# Patient Record
Sex: Female | Born: 2000 | Race: White | Hispanic: No | Marital: Married | Smoking: Never smoker
Health system: Southern US, Community
[De-identification: ages and names within clinical notes are randomized; demographics above are authoritative.]

## PROBLEM LIST (undated history)

## (undated) DIAGNOSIS — N83209 Unspecified ovarian cyst, unspecified side: Secondary | ICD-10-CM

## (undated) DIAGNOSIS — F419 Anxiety disorder, unspecified: Secondary | ICD-10-CM

## (undated) DIAGNOSIS — N63 Unspecified lump in unspecified breast: Secondary | ICD-10-CM

## (undated) HISTORY — DX: Unspecified ovarian cyst, unspecified side: N83.209

## (undated) HISTORY — DX: Anxiety disorder, unspecified: F41.9

## (undated) HISTORY — PX: NO PAST SURGERIES: SHX2092

---
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
## 2021-02-18 ENCOUNTER — Encounter: Payer: Self-pay | Admitting: Physician Assistant

## 2021-02-18 ENCOUNTER — Ambulatory Visit (INDEPENDENT_AMBULATORY_CARE_PROVIDER_SITE_OTHER): Payer: BC Managed Care – PPO | Admitting: Physician Assistant

## 2021-02-18 ENCOUNTER — Other Ambulatory Visit: Payer: Self-pay

## 2021-02-18 VITALS — BP 121/96 | HR 126 | Temp 98.9°F | Ht 61.0 in | Wt 193.1 lb

## 2021-02-18 DIAGNOSIS — F411 Generalized anxiety disorder: Secondary | ICD-10-CM | POA: Diagnosis not present

## 2021-02-18 DIAGNOSIS — R030 Elevated blood-pressure reading, without diagnosis of hypertension: Secondary | ICD-10-CM | POA: Diagnosis not present

## 2021-02-18 MED ORDER — SERTRALINE HCL 50 MG PO TABS: 50.0000 mg | ORAL_TABLET | Freq: Every day | ORAL | 1 refills | Status: DC

## 2021-02-18 NOTE — Progress Notes (Signed)
I,Pamela Salas,acting as a Education administrator for [employer], PA-C.,have documented all relevant documentation on the behalf of Pamela Kirschner, PA-C,as directed by  Pamela Kirschner, PA-C while in the presence of Pamela Kirschner, PA-C.  New Patient Office Visit  Subjective:  Patient ID: Pamela Salas, female    DOB: 03-10-2000  Age: 21 y.o. MRN: [id number]  CC: No chief complaint on file.   HPI Pamela Salas presents for anxiety.  Anxiety Patient is here for evaluation of anxiety.  She has the following anxiety symptoms: racing thoughts, nausea and headaches . Onset of symptoms was approximately several years ago.  Symptoms have been gradually worsening since that time. She denies current suicidal and homicidal ideation. Family history significant for anxiety.Possible organic causes contributing are: none. Risk factors: none Previous treatment includes none.   She is noticing her anxiety is beginning to affect her day to day life, she quit her job due to the stress and anxiety it was bringing her, she has cancelled several interviews due to anxiety. Feels she cannot talk to her husband about it, nervous about seeing a therapist. Anxious making appointments for herself, calling to order food.  Will feel flushed, nauseous when her anxiety is at its peak.   Past Medical History:  Diagnosis Date   Breast mass     Past Surgical History:  Procedure Laterality Date   NO PAST SURGERIES      Family History  Problem Relation Age of Onset   Fibromyalgia Mother    Healthy Father    Healthy Sister    Healthy Brother    Depression Maternal Grandmother    Thyroid disease Maternal Grandmother    Aneurysm Maternal Grandmother    Colon cancer Maternal Grandfather    Lung cancer Maternal Grandfather    Healthy Paternal Grandmother    Healthy Paternal Grandfather     Social History   Socioeconomic History   Marital status: Single    Spouse name: Not on file   Number of children: Not on file    Years of education: Not on file   Highest education level: Not on file  Occupational History   Not on file  Tobacco Use   Smoking status: Never   Smokeless tobacco: Never  Substance and Sexual Activity   Alcohol use: No    Alcohol/week: 0.0 standard drinks   Drug use: No   Sexual activity: Not on file  Other Topics Concern   Not on file  Social History Narrative   Not on file   Social Determinants of Health   Financial Resource Strain: Not on file  Food Insecurity: Not on file  Transportation Needs: Not on file  Physical Activity: Not on file  Stress: Not on file  Social Connections: Not on file  Intimate Partner Violence: Not on file    ROS Review of Systems  Cardiovascular:  Negative for chest pain.  Psychiatric/Behavioral:  Positive for agitation. Negative for sleep disturbance and suicidal ideas. The patient is nervous/anxious.    Objective:   Blood pressure (!) 121/96, pulse (!) 126, temperature 98.9 F (37.2 C), temperature source Oral, height 5\' 1"  (1.549 m), weight 193 lb 1.6 oz (87.6 kg), SpO2 100 %.   Physical Exam Constitutional:      General: She is awake.     Appearance: She is well-developed.  HENT:     Head: Normocephalic.  Eyes:     Conjunctiva/sclera: Conjunctivae normal.  Cardiovascular:     Rate and Rhythm: Regular rhythm. Tachycardia  present.     Heart sounds: Normal heart sounds.  Pulmonary:     Effort: Pulmonary effort is normal.     Breath sounds: Normal breath sounds.  Skin:    General: Skin is warm.  Neurological:     Mental Status: She is alert and oriented to person, place, and time.  Psychiatric:        Attention and Perception: Attention normal.        Mood and Affect: Mood normal.        Speech: Speech normal.        Behavior: Behavior is cooperative.    Assessment & Plan:   Problem List Items Addressed This Visit       Other   GAD (generalized anxiety disorder) - Primary    Lengthy discussion of anxiety and how it  can impact [place] physically Advised therapy + medication are the best combo, but she feels she cannot start therapy at this time rx zoloft 50 mg daily, advised of SE and SE of SI, denies current SI Will f/u 6 weeks      Relevant Medications   sertraline (ZOLOFT) 50 MG tablet   Elevated blood-pressure reading without diagnosis of hypertension    Repeat much lower. Likely anxiety driven will monitor        Return in about 6 weeks (around 04/01/2021) for anxiety. I, Pamela Kirschner, PA-C have reviewed all documentation for this visit. The documentation on  02/18/2021 for the exam, diagnosis, procedures, and orders are all accurate and complete.  Pamela Kirschner, PA-C [facility] [address] [place], [place], [address] Office: [phone] Fax: [phone]

## 2021-02-18 NOTE — Assessment & Plan Note (Signed)
Lengthy discussion of anxiety and how it can impact [place] physically Advised therapy + medication are the best combo, but she feels she cannot start therapy at this time rx zoloft 50 mg daily, advised of SE and SE of SI, denies current SI Will f/u 6 weeks

## 2021-02-18 NOTE — Assessment & Plan Note (Signed)
Repeat much lower. Likely anxiety driven will monitor

## 2021-04-01 ENCOUNTER — Ambulatory Visit (INDEPENDENT_AMBULATORY_CARE_PROVIDER_SITE_OTHER): Payer: BC Managed Care – PPO | Admitting: Physician Assistant

## 2021-04-01 ENCOUNTER — Other Ambulatory Visit: Payer: Self-pay

## 2021-04-01 ENCOUNTER — Encounter: Payer: Self-pay | Admitting: Physician Assistant

## 2021-04-01 VITALS — BP 135/97 | HR 103 | Ht 61.0 in | Wt 191.7 lb

## 2021-04-01 DIAGNOSIS — R030 Elevated blood-pressure reading, without diagnosis of hypertension: Secondary | ICD-10-CM

## 2021-04-01 DIAGNOSIS — Z23 Encounter for immunization: Secondary | ICD-10-CM | POA: Diagnosis not present

## 2021-04-01 DIAGNOSIS — F411 Generalized anxiety disorder: Secondary | ICD-10-CM | POA: Diagnosis not present

## 2021-04-01 NOTE — Progress Notes (Signed)
I,Pamela Salas,acting as a Education administrator for [employer], PA-C.,have documented all relevant documentation on the behalf of Pamela Kirschner, PA-C,as directed by  Pamela Kirschner, PA-C while in the presence of Pamela Kirschner, PA-C.  Established Patient Office Visit  Subjective:  Patient ID: Pamela Salas, female    DOB: December 10, 2000  Age: 21 y.o. MRN: [id number]  CC: anxiety f/u  HPI CABELA GUMMER presents for a 6 week for follow-up for anxiety. Anxiety, Follow-up  She was last seen for anxiety 6 weeks ago. Changes made at last visit include zoloft 50 mg qd.   She reports excellent compliance with treatment. She reports excellent tolerance of treatment. She is not having side effects. Was at first of nauseas and insomnia but both have stopped.  She feels her anxiety is mild and Improved since last visit. Reports less irritable, able to have normal conversations w/ partner, able to call and order food/make appointments. Feels like out of control, less physical anxiety symptoms. Reports feeling normal nervousness--which makes her happy because it does not progress beyond that.  Denies trying to look for job or interview quite yet. Denies SI.  Symptoms: No chest pain No difficulty concentrating  No dizziness No fatigue  No feelings of losing control No insomnia  No irritable No palpitations  No panic attacks No racing thoughts  No shortness of breath No sweating  No tremors/shakes    GAD-7 Results GAD-7 Generalized Anxiety Disorder Screening Tool 04/01/2021 02/18/2021  1. Feeling Nervous, Anxious, or on Edge 0 3  2. Not Being Able to Stop or Control Worrying 0 3  3. Worrying Too Much About Different Things 0 3  4. Trouble Relaxing 0 0  5. Being So Restless it's Hard To Sit Still 0 0  6. Becoming Easily Annoyed or Irritable 0 1  7. Feeling Afraid As If Something Awful Might Happen 0 1  Total GAD-7 Score 0 11  Difficulty At Work, Home, or Getting  Along With Others? Not difficult at  all Somewhat difficult    PHQ-9 Scores PHQ9 SCORE ONLY 04/01/2021 02/18/2021  PHQ-9 Total Score 1 2    ---------------------------------------------------------------------------------------------------   Past Medical History:  Diagnosis Date   Breast mass     Past Surgical History:  Procedure Laterality Date   NO PAST SURGERIES      Family History  Problem Relation Age of Onset   Fibromyalgia Mother    Healthy Father    Healthy Sister    Healthy Brother    Depression Maternal Grandmother    Thyroid disease Maternal Grandmother    Aneurysm Maternal Grandmother    Colon cancer Maternal Grandfather    Lung cancer Maternal Grandfather    Healthy Paternal Grandmother    Healthy Paternal Grandfather     Social History   Socioeconomic History   Marital status: Married    Spouse name: Not on file   Number of children: Not on file   Years of education: Not on file   Highest education level: Not on file  Occupational History   Not on file  Tobacco Use   Smoking status: Never   Smokeless tobacco: Never  Substance and Sexual Activity   Alcohol use: No    Alcohol/week: 0.0 standard drinks   Drug use: No   Sexual activity: Not on file  Other Topics Concern   Not on file  Social History Narrative   Not on file   Social Determinants of Health   Financial Resource Strain: Not on  file  Food Insecurity: Not on file  Transportation Needs: Not on file  Physical Activity: Not on file  Stress: Not on file  Social Connections: Not on file  Intimate Partner Violence: Not on file    Outpatient Medications Prior to Visit  Medication Sig Dispense Refill   sertraline (ZOLOFT) 50 MG tablet Take 1 tablet (50 mg total) by mouth daily. 90 tablet 1   No facility-administered medications prior to visit.    No Known Allergies  ROS Review of Systems  Constitutional:  Negative for fatigue and fever.  Respiratory:  Negative for cough and shortness of breath.    Cardiovascular:  Negative for chest pain and leg swelling.  Gastrointestinal:  Negative for abdominal pain.  Neurological:  Negative for dizziness and headaches.     Objective:    Physical Exam Constitutional:      Appearance: Normal appearance. She is not ill-appearing.  HENT:     Head: Normocephalic.  Eyes:     Conjunctiva/sclera: Conjunctivae normal.  Cardiovascular:     Rate and Rhythm: Normal rate.  Pulmonary:     Effort: Pulmonary effort is normal.  Neurological:     Mental Status: She is oriented to person, place, and time.  Psychiatric:        Mood and Affect: Mood normal.        Behavior: Behavior normal.        Thought Content: Thought content normal.        Judgment: Judgment normal.    BP (!) 135/97 (BP Location: Left Arm, Patient Position: Sitting, Cuff Size: Normal)    Pulse (!) 103    Ht 5\' 1"  (1.549 m)    Wt 191 lb 11.2 oz (87 kg)    SpO2 98%    BMI 36.22 kg/m  Wt Readings from Last 3 Encounters:  04/01/21 191 lb 11.2 oz (87 kg)  02/18/21 193 lb 1.6 oz (87.6 kg)  03/14/17 168 lb 3.2 oz (76.3 kg) (93 %, Z= 1.48)*   * Growth percentiles are based on CDC (Girls, 2-20 Years) data.     Health Maintenance Due  Topic Date Due   COVID-19 Vaccine (1) Never done   HIV Screening  Never done   HPV VACCINES (2 - 3-dose series) 02/01/2015   Hepatitis C Screening  Never done   PAP-Cervical Cytology Screening  Never done   PAP SMEAR-Modifier  Never done       Topic Date Due   HPV VACCINES (2 - 3-dose series) 02/01/2015    No results found for: TSH Lab Results  Component Value Date   WBC 13.7 05/17/2011   HGB 12.7 05/17/2011   HCT 38.8 05/17/2011   MCV 86 05/17/2011   PLT 237 05/17/2011   Lab Results  Component Value Date   NA 142 (H) 05/17/2011   K 3.8 05/17/2011   CO2 25 05/17/2011   GLUCOSE 119 (H) 05/17/2011   BUN 9 05/17/2011   CREATININE 0.72 05/17/2011   BILITOT 0.2 05/17/2011   ALKPHOS 113 (L) 05/17/2011   AST 23 05/17/2011   ALT 17  05/17/2011   PROT 7.2 05/17/2011   ALBUMIN 4.2 05/17/2011   CALCIUM 9.2 05/17/2011   ANIONGAP 10 05/17/2011   No results found for: CHOL No results found for: HDL No results found for: LDLCALC No results found for: TRIG No results found for: CHOLHDL No results found for: WFUX3A    Assessment & Plan:   Problem List Items Addressed This Visit  Other   GAD (generalized anxiety disorder) - Primary    Much improved! Continue Zoloft 50 mg Discussed therapy again, she still does not feel ready. Advised she can reach out at any time.        Elevated blood-pressure reading without diagnosis of hypertension    Elevated again, if at next visit which should be CPE still elevated, will advise she check at home.  Likely white coat/anxiety HTN      Other Visit Diagnoses     Need for Td vaccine       Relevant Orders   Td : Tetanus/diphtheria >7yo Preservative  free (Completed)      Return in about 8 weeks (around 05/27/2021) for CPE.  I, Pamela Kirschner, PA-C have reviewed all documentation for this visit. The documentation on  04/01/2021 for the exam, diagnosis, procedures, and orders are all accurate and complete.  Pamela Kirschner, PA-C [facility] [address] [place], [place], [address] Office: [phone] Fax: [phone]

## 2021-04-01 NOTE — Assessment & Plan Note (Signed)
Much improved! Continue Zoloft 50 mg Discussed therapy again, she still does not feel ready. Advised she can reach out at any time.

## 2021-04-01 NOTE — Assessment & Plan Note (Signed)
Elevated again, if at next visit which should be CPE still elevated, will advise she check at home.  Likely white coat/anxiety HTN

## 2021-06-03 ENCOUNTER — Encounter: Payer: Self-pay | Admitting: Physician Assistant

## 2021-06-03 ENCOUNTER — Other Ambulatory Visit (HOSPITAL_COMMUNITY)
Admission: RE | Admit: 2021-06-03 | Discharge: 2021-06-03 | Disposition: A | Discharge status: Home / Self Care | Payer: BC Managed Care – PPO | Source: Ambulatory Visit | Attending: Physician Assistant | Admitting: Physician Assistant

## 2021-06-03 ENCOUNTER — Ambulatory Visit (INDEPENDENT_AMBULATORY_CARE_PROVIDER_SITE_OTHER): Payer: BC Managed Care – PPO | Admitting: Physician Assistant

## 2021-06-03 VITALS — BP 135/90 | HR 106 | Temp 98.2°F | Ht 61.0 in | Wt 181.6 lb

## 2021-06-03 DIAGNOSIS — Z114 Encounter for screening for human immunodeficiency virus [HIV]: Secondary | ICD-10-CM | POA: Diagnosis not present

## 2021-06-03 DIAGNOSIS — Z124 Encounter for screening for malignant neoplasm of cervix: Secondary | ICD-10-CM | POA: Diagnosis not present

## 2021-06-03 DIAGNOSIS — Z113 Encounter for screening for infections with a predominantly sexual mode of transmission: Secondary | ICD-10-CM

## 2021-06-03 DIAGNOSIS — R37 Sexual dysfunction, unspecified: Secondary | ICD-10-CM | POA: Diagnosis not present

## 2021-06-03 DIAGNOSIS — Z1159 Encounter for screening for other viral diseases: Secondary | ICD-10-CM | POA: Diagnosis not present

## 2021-06-03 DIAGNOSIS — F411 Generalized anxiety disorder: Secondary | ICD-10-CM

## 2021-06-03 DIAGNOSIS — Z23 Encounter for immunization: Secondary | ICD-10-CM | POA: Diagnosis not present

## 2021-06-03 DIAGNOSIS — Z0000 Encounter for general adult medical examination without abnormal findings: Secondary | ICD-10-CM

## 2021-06-03 DIAGNOSIS — E669 Obesity, unspecified: Secondary | ICD-10-CM | POA: Diagnosis not present

## 2021-06-03 MED ORDER — BUPROPION HCL ER (SR) 150 MG PO TB12: 150.0000 mg | ORAL_TABLET | Freq: Every day | ORAL | 1 refills | Status: DC

## 2021-06-03 NOTE — Assessment & Plan Note (Signed)
Discussed open discussion with partner ?Trying new things ?Could try wellbutrin 150 mg daily.  ?Advised could eventually increase to 150 mg bid ?F/u 6 mo but earlier if needed ?

## 2021-06-03 NOTE — Progress Notes (Deleted)
? ?I,Sha'taria Adenike Shidler,acting as a Education administrator for [employer], PA-C.,have documented all relevant documentation on the behalf of [name], PA-C,as directed by  [name], PA-C while in the presence of [name], PA-C. ? ?Complete physical exam ? ? ?Patient: Pamela Salas   DOB: 08-Nov-2000   20 y.o. Female  MRN: [id number] ?Visit Date: 06/03/2021 ? ?Today's healthcare provider: [name], PA-C  ? ?No chief complaint on file. ? ?Subjective  ?  ?Pamela Salas is a 21 y.o. female who presents today for a complete physical exam.  ?She reports consuming a  low carb  diet.  The patient reports going to the gym at least 4 time a week for at least a hour using the treadmill and light weights.  She generally feels well. She reports sleeping fairly well. She does not have additional problems to discuss today.  ?HPI  ?-Menstrual shorter  ? ?Past Medical History:  ?Diagnosis Date  ? Breast mass   ? ?Past Surgical History:  ?Procedure Laterality Date  ? NO PAST SURGERIES    ? ?Social History  ? ?Socioeconomic History  ? Marital status: Married  ?  Spouse name: Not on file  ? Number of children: Not on file  ? Years of education: Not on file  ? Highest education level: Not on file  ?Occupational History  ? Not on file  ?Tobacco Use  ? Smoking status: Never  ? Smokeless tobacco: Never  ?Substance and Sexual Activity  ? Alcohol use: No  ?  Alcohol/week: 0.0 standard drinks  ? Drug use: No  ? Sexual activity: Not on file  ?Other Topics Concern  ? Not on file  ?Social History Narrative  ? Not on file  ? ?Social Determinants of Health  ? ?Financial Resource Strain: Not on file  ?Food Insecurity: Not on file  ?Transportation Needs: Not on file  ?Physical Activity: Not on file  ?Stress: Not on file  ?Social Connections: Not on file  ?Intimate Partner Violence: Not on file  ? ?Family Status  ?Relation Name Status  ? Mother  Alive  ? Father  Alive  ? Sister  Alive  ? Brother  Alive  ? MGM  Alive  ? MGF  Deceased  ? PGM   Alive  ? PGF  Alive  ? ?Family History  ?Problem Relation Age of Onset  ? Fibromyalgia Mother   ? Healthy Father   ? Healthy Sister   ? Healthy Brother   ? Depression Maternal Grandmother   ? Thyroid disease Maternal Grandmother   ? Aneurysm Maternal Grandmother   ? Colon cancer Maternal Grandfather   ? Lung cancer Maternal Grandfather   ? Healthy Paternal Grandmother   ? Healthy Paternal Grandfather   ? ?No Known Allergies  ?Patient Care Team: ?Emelia Loron as PCP - General (Physician Assistant)  ? ?Medications: ?Outpatient Medications Prior to Visit  ?Medication Sig  ? sertraline (ZOLOFT) 50 MG tablet Take 1 tablet (50 mg total) by mouth daily.  ? ?No facility-administered medications prior to visit.  ? ? ?Review of Systems  ?Allergic/Immunologic: Positive for environmental allergies.  ?All other systems reviewed and are negative. ? ?{Labs  Heme  Chem  Endocrine  Serology  Results Review (optional):23779} ? Objective  ?  ?There were no vitals taken for this visit. ?{Show previous vital signs (optional):23777} ? ? ?Physical Exam  ?*** ? ?Last depression screening scores ? ?  04/01/2021  ?  1:44 PM 02/18/2021  ?  4:00  PM  ?PHQ 2/9 Scores  ?PHQ - 2 Score 0 0  ?PHQ- 9 Score 1 2  ? ?Last fall risk screening ? ?  04/01/2021  ?  1:44 PM  ?Fall Risk   ?Falls in the past year? 0  ?Number falls in past yr: 0  ?Injury with Fall? 0  ?Risk for fall due to : No Fall Risks  ? ?Last Audit-C alcohol use screening ? ?  04/01/2021  ?  1:42 PM  ?Alcohol Use Disorder Test (AUDIT)  ?1. How often do you have a drink containing alcohol? 0  ?2. How many drinks containing alcohol do you have on a typical day when you are drinking? 0  ?3. How often do you have six or more drinks on one occasion? 1  ?AUDIT-C Score 1  ? ?A score of 3 or more in women, and 4 or more in men indicates increased risk for alcohol abuse, EXCEPT if all of the points are from question 1  ? ?No results found for any visits on 06/03/21. ? Assessment & Plan  ?   ?Routine Health Maintenance and Physical Exam ? ?Exercise Activities and Dietary recommendations ? Goals   ?None ?  ? ? ?Immunization History  ?Administered Date(s) Administered  ? DTaP 10/19/1999, 02/29/2000, 04/03/2000, 12/05/2000, 09/13/2004  ? HPV 9-valent 01/04/2015  ? Hepatitis A 09/13/2010, 04/06/2011  ? Hepatitis B 2000-09-03, 02/29/2000, 11/17/2004  ? HiB (PRP-OMP) 10/19/1999, 02/29/2000, 04/03/2000, 10/22/2000  ? IPV 10/19/1999, 02/29/2000, 09/13/2004  ? Influenza,inj,Quad PF,6+ Mos 01/04/2015, 12/17/2015  ? MMR 12/05/2000, 09/13/2004, 10/25/2004  ? Meningococcal Conjugate 09/13/2010  ? Pneumococcal-Unspecified 10/19/1999, 02/29/2000, 04/03/2000  ? Td 09/13/2010, 04/01/2021  ? Tdap 09/13/2010  ? Varicella 10/22/2000, 09/13/2010  ? ? ?Health Maintenance  ?Topic Date Due  ? COVID-19 Vaccine (1) Never done  ? HIV Screening  Never done  ? HPV VACCINES (2 - 3-dose series) 02/01/2015  ? Hepatitis C Screening  Never done  ? PAP-Cervical Cytology Screening  Never done  ? PAP SMEAR-Modifier  Never done  ? INFLUENZA VACCINE  09/06/2021  ? TETANUS/TDAP  04/02/2031  ? ? ?Discussed health benefits of physical activity, and encouraged her to engage in regular exercise appropriate for her age and condition. ? ?*** ? ?No follow-ups on file.  ?  ? ?{provider attestation***:1} ? ? ?[name], PA-C  ?[facility] ?[phone] (phone) ?[phone] (fax) ? ?[facility] ? ?

## 2021-06-03 NOTE — Progress Notes (Signed)
? ?I,[name],acting as a Education administrator for [employer], PA-C.,have documented all relevant documentation on the behalf of [name], PA-C,as directed by  [name], PA-C while in the presence of [name], PA-C. ? ?Complete physical exam ? ? ?Patient: Pamela Salas   DOB: 2000-04-10   20 y.o. Female  MRN: [id number] ?Visit Date: 06/03/2021 ? ?Today's healthcare provider: [name], PA-C  ? ?Cc. cpe ? ?Subjective  ?  ?Pamela Salas is a 21 y.o. female who presents today for a complete physical exam.  ?She reports consuming a  low carb  diet.  The patient reports going to the gym at least 4 time a week for at least a hour using the treadmill and light weights.  She generally feels well. She reports sleeping fairly well. She does not have additional problems to discuss today.  ?HPI  ?Reports in general her menstrual cycles have been getting shorter. ?Reports some sexual dysfunction with the sertraline. No interest in sex.  ? ?Past Medical History:  ?Diagnosis Date  ? Breast mass   ? ?Past Surgical History:  ?Procedure Laterality Date  ? NO PAST SURGERIES    ? ?Social History  ? ?Socioeconomic History  ? Marital status: Married  ?  Spouse name: Not on file  ? Number of children: Not on file  ? Years of education: Not on file  ? Highest education level: Not on file  ?Occupational History  ? Not on file  ?Tobacco Use  ? Smoking status: Never  ? Smokeless tobacco: Never  ?Substance and Sexual Activity  ? Alcohol use: No  ?  Alcohol/week: 0.0 standard drinks  ? Drug use: No  ? Sexual activity: Not on file  ?Other Topics Concern  ? Not on file  ?Social History Narrative  ? Not on file  ? ?Social Determinants of Health  ? ?Financial Resource Strain: Not on file  ?Food Insecurity: Not on file  ?Transportation Needs: Not on file  ?Physical Activity: Not on file  ?Stress: Not on file  ?Social Connections: Not on file  ?Intimate Partner Violence: Not on file  ? ?Family Status  ?Relation Name Status  ?  Mother  Alive  ? Father  Alive  ? Sister  Alive  ? Brother  Alive  ? MGM  Alive  ? MGF  Deceased  ? PGM  Alive  ? PGF  Alive  ? ?Family History  ?Problem Relation Age of Onset  ? Fibromyalgia Mother   ? Healthy Father   ? Healthy Sister   ? Healthy Brother   ? Depression Maternal Grandmother   ? Thyroid disease Maternal Grandmother   ? Aneurysm Maternal Grandmother   ? Colon cancer Maternal Grandfather   ? Lung cancer Maternal Grandfather   ? Healthy Paternal Grandmother   ? Healthy Paternal Grandfather   ? ?No Known Allergies  ?Patient Care Team: ?Emelia Loron as PCP - General (Physician Assistant)  ? ?Medications: ?Outpatient Medications Prior to Visit  ?Medication Sig  ? sertraline (ZOLOFT) 50 MG tablet Take 1 tablet (50 mg total) by mouth daily.  ? ?No facility-administered medications prior to visit.  ? ? ?Review of Systems  ?Allergic/Immunologic: Positive for environmental allergies.  ?All other systems reviewed and are negative. ? ? Objective  ? ?  ?Blood pressure 135/90, pulse (!) 106, temperature 98.2 ?F (36.8 ?C), temperature source Oral, height _0  (1.549 m), weight 181 lb 9.6 oz (82.4 kg), SpO2 100 %.  ? ? ?Physical Exam  ?  Physical Exam ?Constitutional:   ?   General: She is awake.  ?   Appearance: She is well-developed. She is not ill-appearing.  ?HENT:  ?   Head: Normocephalic.  ?   Right Ear: Tympanic membrane normal.  ?   Left Ear: Tympanic membrane normal.  ?   Nose: Nose normal. No congestion or rhinorrhea.  ?   Mouth/Throat:  ?   Pharynx: No oropharyngeal exudate or posterior oropharyngeal erythema.  ?Eyes:  ?   Conjunctiva/sclera: Conjunctivae normal.  ?   Pupils: Pupils are equal, round, and reactive to light.  ?Neck:  ?   Thyroid: No thyroid mass or thyromegaly.  ?Cardiovascular:  ?   Rate and Rhythm: Normal rate and regular rhythm.  ?   Heart sounds: Normal heart sounds.  ?Pulmonary:  ?   Effort: Pulmonary effort is normal.  ?   Breath sounds: Normal breath sounds.  ?Abdominal:  ?    Palpations: Abdomen is soft.  ?   Tenderness: There is no abdominal tenderness.  ?Genitourinary: ?   Pubic Area: No rash.   ?   Labia:     ?   Right: No rash or lesion.     ?   Left: No rash or lesion.   ?   Vagina: Normal.  ?   Cervix: Normal.  ?   Uterus: Normal.   ?   Adnexa: Right adnexa normal and left adnexa normal.  ?Musculoskeletal:  ?   Right lower leg: No swelling. No edema.  ?   Left lower leg: No swelling. No edema.  ?Lymphadenopathy:  ?   Cervical: No cervical adenopathy.  ?Skin: ?   General: Skin is warm.  ?Neurological:  ?   Mental Status: She is alert and oriented to person, place, and time.  ?Psychiatric:     ?   Attention and Perception: Attention normal.     ?   Mood and Affect: Mood normal.     ?   Speech: Speech normal.     ?   Behavior: Behavior normal. Behavior is cooperative.  ? ? ? ? ?Last depression screening scores ? ?  06/03/2021  ?  3:31 PM 04/01/2021  ?  1:44 PM 02/18/2021  ?  4:00 PM  ?PHQ 2/9 Scores  ?PHQ - 2 Score 0 0 0  ?PHQ- 9 Score 0 1 2  ? ? ?  04/01/2021  ?  1:45 PM 02/18/2021  ?  4:00 PM  ?GAD 7 : Generalized Anxiety Score  ?Nervous, Anxious, on Edge 0 3  ?Control/stop worrying 0 3  ?Worry too much - different things 0 3  ?Trouble relaxing 0 0  ?Restless 0 0  ?Easily annoyed or irritable 0 1  ?Afraid - awful might happen 0 1  ?Total GAD 7 Score 0 11  ?Anxiety Difficulty Not difficult at all Somewhat difficult  ? ?  ?Last fall risk screening ? ?  06/03/2021  ?  3:30 PM  ?Fall Risk   ?Falls in the past year? 0  ?Number falls in past yr: 0  ?Injury with Fall? 0  ?Risk for fall due to : No Fall Risks  ? ?Last Audit-C alcohol use screening ? ?  06/03/2021  ?  3:30 PM  ?Alcohol Use Disorder Test (AUDIT)  ?1. How often do you have a drink containing alcohol? 2  ?2. How many drinks containing alcohol do you have on a typical day when you are drinking? 0  ?3. How often do you  have six or more drinks on one occasion? 1  ?AUDIT-C Score 3  ? ?A score of 3 or more in women, and 4 or more in men  indicates increased risk for alcohol abuse, EXCEPT if all of the points are from question 1  ? ?No results found for any visits on 06/03/21. ? Assessment & Plan  ?  ?Routine Health Maintenance and Physical Exam ? ?Exercise Activities and Dietary recommendations ?--balanced diet high in fiber and protein, low in sugars, carbs, fats. ?--physical activity/exercise 30 minutes 3-5 times a week   ? ? ?Immunization History  ?Administered Date(s) Administered  ? DTaP 10/19/1999, 02/29/2000, 04/03/2000, 12/05/2000, 09/13/2004  ? HPV 9-valent 01/04/2015, 06/03/2021  ? Hepatitis A 09/13/2010, 04/06/2011  ? Hepatitis B [birth year]99, 02/29/2000, 11/17/2004  ? HiB (PRP-OMP) 10/19/1999, 02/29/2000, 04/03/2000, 10/22/2000  ? IPV 10/19/1999, 02/29/2000, 09/13/2004  ? Influenza,inj,Quad PF,6+ Mos 01/04/2015, 12/17/2015  ? MMR 12/05/2000, 09/13/2004, 10/25/2004  ? Meningococcal Conjugate 09/13/2010  ? Pneumococcal-Unspecified 10/19/1999, 02/29/2000, 04/03/2000  ? Td 09/13/2010, 04/01/2021  ? Tdap 09/13/2010  ? Varicella 10/22/2000, 09/13/2010  ? ? ?Health Maintenance  ?Topic Date Due  ? Hepatitis C Screening  Never done  ? PAP-Cervical Cytology Screening  Never done  ? PAP SMEAR-Modifier  Never done  ? HPV VACCINES (3 - 3-dose series) 10/03/2021  ? COVID-19 Vaccine (1) 06/19/2021 (Originally 02/13/2000)  ? INFLUENZA VACCINE  09/06/2021  ? TETANUS/TDAP  04/02/2031  ? HIV Screening  Completed  ? ? ?Discussed health benefits of physical activity, and encouraged her to engage in regular exercise appropriate for her age and condition. ? ?Problem List Items Addressed This Visit   ? ?  ? Other  ? GAD (generalized anxiety disorder)  ?  Much improved with sertraline. Having some sexual side effects, see other note ? ?  ?  ? Relevant Medications  ? buPROPion (WELLBUTRIN SR) 150 MG 12 hr tablet  ? Other Relevant Orders  ? TSH + free T4  ? Sexual dysfunction  ?  Discussed open discussion with partner ?Trying new things ?Could try wellbutrin 150 mg  daily.  ?Advised could eventually increase to 150 mg bid ?F/u 6 mo but earlier if needed ? ?  ?  ? Relevant Medications  ? buPROPion (WELLBUTRIN SR) 150 MG 12 hr tablet  ? ?Other Visit Diagnoses   ? ? [place]

## 2021-06-03 NOTE — Assessment & Plan Note (Signed)
Much improved with sertraline. Having some sexual side effects, see other note ?

## 2021-06-04 LAB — HEMOGLOBIN A1C
Est. average glucose Bld gHb Est-mCnc: 108 mg/dL
Hgb A1c MFr Bld: 5.4 % (ref 4.8–5.6)

## 2021-06-04 LAB — COMPREHENSIVE METABOLIC PANEL
ALT: 19 IU/L (ref 0–32)
AST: 21 IU/L (ref 0–40)
Albumin/Globulin Ratio: 2 (ref 1.2–2.2)
Albumin: 4.5 g/dL (ref 3.9–5.0)
Alkaline Phosphatase: 96 IU/L (ref 44–121)
BUN/Creatinine Ratio: 19 (ref 9–23)
BUN: 11 mg/dL (ref 6–20)
Bilirubin Total: <0.2 mg/dL (ref 0.0–1.2)
CO2: 17 mmol/L — ABNORMAL LOW (ref 20–29)
Calcium: 9.3 mg/dL (ref 8.7–10.2)
Chloride: 102 mmol/L (ref 96–106)
Creatinine, Ser: 0.58 mg/dL (ref 0.57–1.00)
Globulin, Total: 2.3 g/dL (ref 1.5–4.5)
Glucose: 73 mg/dL (ref 70–99)
Potassium: 4.4 mmol/L (ref 3.5–5.2)
Sodium: 140 mmol/L (ref 134–144)
Total Protein: 6.8 g/dL (ref 6.0–8.5)
eGFR: 132 mL/min/{1.73_m2} (ref >59)

## 2021-06-04 LAB — CBC WITH DIFFERENTIAL/PLATELET
Basophils Absolute: 0 10*3/uL (ref 0.0–0.2)
Basos: 0 %
EOS (ABSOLUTE): 0 10*3/uL (ref 0.0–0.4)
Eos: 0 %
Hematocrit: 37.8 % (ref 34.0–46.6)
Hemoglobin: 12.4 g/dL (ref 11.1–15.9)
Immature Grans (Abs): 0 10*3/uL (ref 0.0–0.1)
Immature Granulocytes: 0 %
Lymphocytes Absolute: 1.5 10*3/uL (ref 0.7–3.1)
Lymphs: 21 %
MCH: 27 pg (ref 26.6–33.0)
MCHC: 32.8 g/dL (ref 31.5–35.7)
MCV: 82 fL (ref 79–97)
Monocytes Absolute: 0.7 10*3/uL (ref 0.1–0.9)
Monocytes: 10 %
Neutrophils Absolute: 4.9 10*3/uL (ref 1.4–7.0)
Neutrophils: 69 %
Platelets: 258 10*3/uL (ref 150–450)
RBC: 4.6 x10E6/uL (ref 3.77–5.28)
RDW: 14.2 % (ref 11.7–15.4)
WBC: 7.3 10*3/uL (ref 3.4–10.8)

## 2021-06-04 LAB — HEPATITIS C ANTIBODY: Hep C Virus Ab: NONREACTIVE

## 2021-06-04 LAB — LIPID PANEL
Chol/HDL Ratio: 4.2 ratio (ref 0.0–4.4)
Cholesterol, Total: 131 mg/dL (ref 100–199)
HDL: 31 mg/dL — ABNORMAL LOW (ref >39)
LDL Chol Calc (NIH): 85 mg/dL (ref 0–99)
Triglycerides: 77 mg/dL (ref 0–149)
VLDL Cholesterol Cal: 15 mg/dL (ref 5–40)

## 2021-06-04 LAB — TSH+FREE T4
Free T4: 1.34 ng/dL (ref 0.82–1.77)
TSH: 1.88 u[IU]/mL (ref 0.450–4.500)

## 2021-06-04 LAB — HIV ANTIBODY (ROUTINE TESTING W REFLEX): HIV Screen 4th Generation wRfx: NONREACTIVE

## 2021-06-08 LAB — CYTOLOGY - PAP
Chlamydia: NEGATIVE
Comment: NEGATIVE
Comment: NEGATIVE
Comment: NEGATIVE
Comment: NORMAL
Diagnosis: NEGATIVE
High risk HPV: NEGATIVE
Neisseria Gonorrhea: NEGATIVE
Trichomonas: NEGATIVE

## 2021-07-25 ENCOUNTER — Other Ambulatory Visit: Payer: Self-pay | Admitting: Physician Assistant

## 2021-07-25 DIAGNOSIS — F411 Generalized anxiety disorder: Secondary | ICD-10-CM

## 2021-10-28 ENCOUNTER — Other Ambulatory Visit: Payer: Self-pay | Admitting: Physician Assistant

## 2021-10-28 DIAGNOSIS — F411 Generalized anxiety disorder: Secondary | ICD-10-CM

## 2021-11-29 DIAGNOSIS — Z111 Encounter for screening for respiratory tuberculosis: Secondary | ICD-10-CM | POA: Diagnosis not present

## 2021-12-01 ENCOUNTER — Ambulatory Visit: Payer: BC Managed Care – PPO | Admitting: Physician Assistant

## 2021-12-01 DIAGNOSIS — Z0279 Encounter for issue of other medical certificate: Secondary | ICD-10-CM | POA: Diagnosis not present

## 2021-12-06 ENCOUNTER — Other Ambulatory Visit: Payer: Self-pay | Admitting: Physician Assistant

## 2021-12-06 DIAGNOSIS — R37 Sexual dysfunction, unspecified: Secondary | ICD-10-CM

## 2021-12-09 ENCOUNTER — Encounter: Payer: Self-pay | Admitting: Physician Assistant

## 2021-12-09 ENCOUNTER — Other Ambulatory Visit: Payer: Self-pay | Admitting: Physician Assistant

## 2021-12-09 ENCOUNTER — Ambulatory Visit: Payer: BC Managed Care – PPO | Admitting: Physician Assistant

## 2021-12-09 VITALS — BP 118/69 | HR 102 | Wt 185.5 lb

## 2021-12-09 DIAGNOSIS — Z3201 Encounter for pregnancy test, result positive: Secondary | ICD-10-CM | POA: Diagnosis not present

## 2021-12-09 NOTE — Progress Notes (Signed)
     I,[name],acting as a Education administrator for [employer], PA-C.,have documented all relevant documentation on the behalf of Pamela Kirschner, PA-C,as directed by  Pamela Kirschner, PA-C while in the presence of Pamela Kirschner, PA-C.   Established patient visit   Patient: Pamela Salas   DOB: 2000-09-01   21 y.o. Female  MRN: [id number] Visit Date: 12/09/2021  Today's healthcare provider: [name], PA-C   Cc. Positive pregnancy test  Subjective    HPI  Pt reports a positive home pregnancy test on Wednesday. Her LMP was 11/05/2021.  She wants to make sure she can continue taking her medications, and wonders about her last HPV vaccine.  She also needs an OB referral.  Medications: Outpatient Medications Prior to Visit  Medication Sig   buPROPion (WELLBUTRIN SR) 150 MG 12 hr tablet TAKE 1 TABLET BY MOUTH EVERY DAY   sertraline (ZOLOFT) 50 MG tablet TAKE 1 TABLET BY MOUTH EVERY DAY   No facility-administered medications prior to visit.    Review of Systems  Constitutional:  Negative for appetite change, chills, fatigue and fever.  Respiratory:  Negative for chest tightness and shortness of breath.   Cardiovascular:  Negative for chest pain and palpitations.  Gastrointestinal:  Negative for abdominal pain, nausea and vomiting.  Neurological:  Negative for dizziness and weakness.     Objective    Blood pressure 118/69, pulse (!) 102, weight 185 lb 8 oz (84.1 kg), last menstrual period 11/05/2021, SpO2 100 %.   Physical Exam Vitals reviewed.  Constitutional:      Appearance: She is not ill-appearing.  HENT:     Head: Normocephalic.  Eyes:     Conjunctiva/sclera: Conjunctivae normal.  Cardiovascular:     Rate and Rhythm: Normal rate.  Pulmonary:     Effort: Pulmonary effort is normal. No respiratory distress.  Neurological:     General: No focal deficit present.     Mental Status: She is alert and oriented to person, place, and time.  Psychiatric:        Mood and  Affect: Mood normal.        Behavior: Behavior normal.     No results found for any visits on 12/09/21.  Assessment & Plan     Positive pregnancy test Offered bhcg bw to confirm Advised her zoloft and wellbutrin are typically continued in pregnancy Advised HPV vaccine is not recommended in pregnancy Ref to OB  Return if symptoms worsen or fail to improve.      I, Pamela Kirschner, PA-C have reviewed all documentation for this visit. The documentation on  12/09/2021 for the exam, diagnosis, procedures, and orders are all accurate and complete.  Pamela Kirschner, PA-C [facility] [address] [place], [place], [address] Office: [phone] Fax: [place]

## 2021-12-10 LAB — BETA HCG QUANT (REF LAB): hCG Quant: 796 m[IU]/mL

## 2021-12-23 ENCOUNTER — Ambulatory Visit (INDEPENDENT_AMBULATORY_CARE_PROVIDER_SITE_OTHER): Payer: Self-pay

## 2021-12-23 DIAGNOSIS — Z3480 Encounter for supervision of other normal pregnancy, unspecified trimester: Secondary | ICD-10-CM | POA: Insufficient documentation

## 2021-12-23 DIAGNOSIS — Z369 Encounter for antenatal screening, unspecified: Secondary | ICD-10-CM

## 2021-12-23 DIAGNOSIS — Z3689 Encounter for other specified antenatal screening: Secondary | ICD-10-CM

## 2021-12-23 HISTORY — DX: Encounter for supervision of other normal pregnancy, unspecified trimester: Z34.80

## 2021-12-23 NOTE — Progress Notes (Signed)
Pt is seeing Doreene Burke for her NOB appt.

## 2021-12-23 NOTE — Progress Notes (Signed)
New OB Intake  I connected with  Pamela Salas on 12/23/21 at  1:15 PM EST by telephone and verified that I am speaking with the correct person using two identifiers. Nurse is located at [facility] and pt is located at home.  I explained I am completing New OB Intake today. We discussed her EDD of 08/12/2022 that is based on LMP of 11/05/2021. Pt is G1/P0. I reviewed her allergies, medications, Medical/Surgical/OB history, and appropriate screenings. Based on history, this is a/an pregnancy uncomplicated .   Patient Active Problem List   Diagnosis Date Noted   Sexual dysfunction 06/03/2021   GAD (generalized anxiety disorder) 02/18/2021   Elevated blood-pressure reading without diagnosis of hypertension 02/18/2021   Hydrosalpinx 12/30/2014   Ovarian retention cyst 12/30/2014    Concerns addressed today None  Delivery Plans:  Plans to deliver at [facility].  Anatomy US Explained first scheduled [place] will be soon and an anatomy scan will be done at 20 weeks.  Labs Discussed genetic screening with patient. Patient desires genetic testing to be drawn with new OB labs. Discussed possible labs to be drawn at new OB appointment.  COVID Vaccine Patient has not had COVID vaccine.   Social Determinants of Health Food Insecurity: denies food insecurity Transportation: Patient denies transportation needs.  First visit review I reviewed new OB appt with pt. I explained she will have ob bloodwork and pap smear/pelvic exam if indicated. Explained pt will be seen by Doreene Burke, CNM at first visit; encounter routed to appropriate provider.   Loran Senters, [place] 12/23/2021  1:33 PM

## 2022-01-16 ENCOUNTER — Ambulatory Visit (INDEPENDENT_AMBULATORY_CARE_PROVIDER_SITE_OTHER): Payer: BC Managed Care – PPO

## 2022-01-16 ENCOUNTER — Other Ambulatory Visit: Payer: BC Managed Care – PPO

## 2022-01-16 DIAGNOSIS — Z3687 Encounter for antenatal screening for uncertain dates: Secondary | ICD-10-CM | POA: Diagnosis not present

## 2022-01-16 DIAGNOSIS — Z3480 Encounter for supervision of other normal pregnancy, unspecified trimester: Secondary | ICD-10-CM

## 2022-01-16 DIAGNOSIS — Z369 Encounter for antenatal screening, unspecified: Secondary | ICD-10-CM

## 2022-01-16 DIAGNOSIS — Z3A10 10 weeks gestation of pregnancy: Secondary | ICD-10-CM | POA: Diagnosis not present

## 2022-01-17 LAB — CBC/D/PLT+RPR+RH+ABO+RUBIGG...
Antibody Screen: NEGATIVE
Basophils Absolute: 0 10*3/uL (ref 0.0–0.2)
Basos: 0 %
EOS (ABSOLUTE): 0 10*3/uL (ref 0.0–0.4)
Eos: 0 %
HCV Ab: NONREACTIVE
HIV Screen 4th Generation wRfx: NONREACTIVE
Hematocrit: 38 % (ref 34.0–46.6)
Hemoglobin: 12.8 g/dL (ref 11.1–15.9)
Hepatitis B Surface Ag: NEGATIVE
Immature Grans (Abs): 0.1 10*3/uL (ref 0.0–0.1)
Immature Granulocytes: 1 %
Lymphocytes Absolute: 1.5 10*3/uL (ref 0.7–3.1)
Lymphs: 14 %
MCH: 28.5 pg (ref 26.6–33.0)
MCHC: 33.7 g/dL (ref 31.5–35.7)
MCV: 85 fL (ref 79–97)
Monocytes Absolute: 0.7 10*3/uL (ref 0.1–0.9)
Monocytes: 6 %
Neutrophils Absolute: 8.5 10*3/uL — ABNORMAL HIGH (ref 1.4–7.0)
Neutrophils: 79 %
Platelets: 252 10*3/uL (ref 150–450)
RBC: 4.49 x10E6/uL (ref 3.77–5.28)
RDW: 13.5 % (ref 11.7–15.4)
RPR Ser Ql: NONREACTIVE
Rh Factor: POSITIVE
Rubella Antibodies, IGG: 3.95 index (ref >0.99)
Varicella zoster IgG: 304 index (ref >165)
WBC: 10.8 10*3/uL (ref 3.4–10.8)

## 2022-01-17 LAB — HCV INTERPRETATION

## 2022-01-20 LAB — MATERNIT 21 PLUS CORE, BLOOD
Fetal Fraction: 5
Result (T21): NEGATIVE
Trisomy 13 (Patau syndrome): NEGATIVE
Trisomy 18 (Edwards syndrome): NEGATIVE
Trisomy 21 (Down syndrome): NEGATIVE

## 2022-01-23 ENCOUNTER — Other Ambulatory Visit (HOSPITAL_COMMUNITY)
Admission: RE | Admit: 2022-01-23 | Discharge: 2022-01-23 | Disposition: A | Discharge status: Home / Self Care | Payer: BC Managed Care – PPO | Source: Ambulatory Visit | Attending: Certified Nurse Midwife | Admitting: Certified Nurse Midwife

## 2022-01-23 ENCOUNTER — Ambulatory Visit (INDEPENDENT_AMBULATORY_CARE_PROVIDER_SITE_OTHER): Payer: BC Managed Care – PPO | Admitting: Certified Nurse Midwife

## 2022-01-23 ENCOUNTER — Encounter: Payer: Self-pay | Admitting: Certified Nurse Midwife

## 2022-01-23 VITALS — BP 137/85 | HR 102 | Wt 186.7 lb

## 2022-01-23 DIAGNOSIS — Z3A11 11 weeks gestation of pregnancy: Secondary | ICD-10-CM

## 2022-01-23 DIAGNOSIS — Z3481 Encounter for supervision of other normal pregnancy, first trimester: Secondary | ICD-10-CM | POA: Diagnosis not present

## 2022-01-23 DIAGNOSIS — O26891 Other specified pregnancy related conditions, first trimester: Secondary | ICD-10-CM | POA: Insufficient documentation

## 2022-01-23 DIAGNOSIS — Z3491 Encounter for supervision of normal pregnancy, unspecified, first trimester: Secondary | ICD-10-CM | POA: Diagnosis not present

## 2022-01-23 LAB — POCT URINALYSIS DIPSTICK OB
Blood, UA: NEGATIVE
Glucose, UA: NEGATIVE
Leukocytes, UA: NEGATIVE
Nitrite, UA: NEGATIVE
POC,PROTEIN,UA: NEGATIVE
Spec Grav, UA: <=1.005 — AB (ref 1.010–1.025)
Urobilinogen, UA: 0.2 E.U./dL
pH, UA: 8 (ref 5.0–8.0)

## 2022-01-23 MED ORDER — ASPIRIN 81 MG PO TBEC: 81.0000 mg | DELAYED_RELEASE_TABLET | Freq: Every day | ORAL | 12 refills | Status: DC

## 2022-01-23 NOTE — Addendum Note (Signed)
Addended by: Fonda Kinder on: 01/23/2022 03:56 PM   Modules accepted: Orders

## 2022-01-23 NOTE — Progress Notes (Addendum)
NEW OB HISTORY AND PHYSICAL  SUBJECTIVE:       Pamela Salas is a 21 y.o. G1P0 female, Patient's last menstrual period was 11/05/2021 (exact date)., Estimated Date of Delivery: 08/12/22, [redacted]w[redacted]d, presents today for establishment of Prenatal Care. She has no unusual complaints.   Body mass index is 35.28 kg/m.  Social Relationship: married  Living : husband Work : Statistician Exercise: none currently  Alcohol/Smoking/Drug use: denies use    Gynecologic History Patient's last menstrual period was 11/05/2021 (exact date). Normal Contraception: none Last Pap: 06/03/21. Results were: normal  Obstetric History OB History  Gravida Para Term Preterm AB Living  1            SAB IAB Ectopic Multiple Live Births               # Outcome Date GA Lbr Len/2nd Weight Sex Delivery Anes PTL Lv  1 Current             Past Medical History:  Diagnosis Date   Anxiety    Breast mass    Ovarian cyst    age 70    Past Surgical History:  Procedure Laterality Date   NO PAST SURGERIES      Current Outpatient Medications on File Prior to Visit  Medication Sig Dispense Refill   buPROPion (WELLBUTRIN SR) 150 MG 12 hr tablet TAKE 1 TABLET BY MOUTH EVERY DAY 90 tablet 1   Prenatal Vit-Fe Fumarate-FA (MULTIVITAMIN-PRENATAL) 27-0.8 MG TABS tablet Take 1 tablet by mouth daily at 12 noon.     sertraline (ZOLOFT) 50 MG tablet TAKE 1 TABLET BY MOUTH EVERY DAY 90 tablet 0   No current facility-administered medications on file prior to visit.    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  Tobacco Use   Smoking status: Never   Smokeless tobacco: Never  Vaping Use   Vaping Use: Never used  Substance and Sexual Activity   Alcohol use: No    Alcohol/week: 0.0 standard drinks of  alcohol   Drug use: No   Sexual activity: Yes    Partners: Male    Birth control/protection: None  Other Topics Concern   Not on file  Social History Narrative   Not on file   Social Determinants of Health   Financial Resource Strain: Low Risk  (12/23/2021)   Overall Financial Resource Strain (CARDIA)    Difficulty of Paying Living Expenses: Not hard at all  Food Insecurity: No Food Insecurity (12/23/2021)   Hunger Vital Sign    Worried About Running Out of Food in the Last Year: Never true    Ran Out of Food in the Last Year: Never true  Transportation Needs: No Transportation Needs (12/23/2021)   PRAPARE - Administrator, Civil Service (Medical): No    Lack of Transportation (Non-Medical): No  Physical Activity: Inactive (12/23/2021)   Exercise Vital Sign    Days of Exercise per Week: 0 days    Minutes of Exercise per Session: 0 min  Stress: No Stress Concern Present (12/23/2021)   [employer] of Occupational Health - Occupational Stress Questionnaire    Feeling of Stress : Not at all  Social Connections: Moderately Integrated (12/23/2021)   Social  Connection and Isolation Panel [NHANES]    Frequency of Communication with Friends and Family: More than three times a week    Frequency of Social Gatherings with Friends and Family: Twice a week    Attends Religious Services: 1 to 4 times per year    Active Member of [employer] or Organizations: No    Attends Archivist Meetings: Never    Marital Status: Married  Human resources officer Violence: Not At Risk (12/23/2021)   Humiliation, Afraid, Rape, and Kick questionnaire    Fear of Current or Ex-Partner: No    Emotionally Abused: No    Physically Abused: No    Sexually Abused: No    Family History  Problem Relation Age of Onset   Cancer Mother 73       breast   Fibromyalgia Mother    Healthy Father    Healthy Sister    Healthy Brother    Diabetes Maternal Grandmother        pre diabetes    Depression Maternal Grandmother    Thyroid disease Maternal Grandmother    Aneurysm Maternal Grandmother    Colon cancer Maternal Grandfather    Lung cancer Maternal Grandfather    Cancer Paternal Grandmother    Healthy Paternal Grandfather     The following portions of the patient's history were reviewed and updated as appropriate: allergies, current medications, past OB history, past medical history, past surgical history, past family history, past social history, and problem list.    OBJECTIVE: Initial Physical Exam (New OB)  GENERAL APPEARANCE: alert, well appearing, in no apparent distress, oriented to person, place and time, overweight HEAD: normocephalic, atraumatic MOUTH: mucous membranes moist, pharynx normal without lesions THYROID: no thyromegaly or masses present BREASTS: no masses noted, no significant tenderness, no palpable axillary nodes, no skin changes LUNGS: clear to auscultation, no wheezes, rales or rhonchi, symmetric air entry HEART: regular rate and rhythm, no murmurs ABDOMEN: soft, nontender, nondistended, no abnormal masses, no epigastric pain, obese, and FHT present EXTREMITIES: no redness or tenderness in the calves or thighs, no edema, no limitation in range of motion, intact peripheral pulses SKIN: normal coloration and turgor, no rashes LYMPH NODES: no adenopathy palpable NEUROLOGIC: alert, oriented, normal speech, no focal findings or movement disorder noted  PELVIC EXAM EXTERNAL GENITALIA: normal appearing vulva with no masses, tenderness or lesions VAGINA: no abnormal discharge or lesions CERVIX: no lesions or cervical motion tenderness UTERUS: gravid ADNEXA: no masses palpable and nontender OB EXAM PELVIMETRY: appears adequate RECTUM: exam not indicated      01/23/2022    3:43 PM 04/01/2021    1:45 PM 02/18/2021    4:00 PM  GAD 7 : Generalized Anxiety Score  Nervous, Anxious, on Edge 0 0 3  Control/stop worrying 0 0 3  Worry too much -  different things 0 0 3  Trouble relaxing 0 0 0  Restless 0 0 0  Easily annoyed or irritable 0 0 1  Afraid - awful might happen 0 0 1  Total GAD 7 Score 0 0 11  Anxiety Difficulty  Not difficult at all Somewhat difficult     ASSESSMENT: Normal pregnancy  PLAN: Prenatal care See ordersNew OB counseling: The patient has been given an overview regarding routine prenatal care. Recommendations regarding diet, weight gain, and exercise in pregnancy were given. Prenatal testing, optional genetic testing, carrier screening, and ultrasound use in pregnancy were reviewed. Matnerit done negative. Asprin ordered due to BMI, discussed with pt and her partner  and they are in agreement. Benefits of Breast Feeding were discussed. The patient is encouraged to consider nursing her baby post partum. Reviewed Medication discussed risk & benfits for Zoloft/Wellbutrin use in pregnancy. Mother to baby fact sheets given.   Philip Aspen, CNM

## 2022-01-23 NOTE — Patient Instructions (Signed)
Prenatal Care Prenatal care is health care during pregnancy. It helps you and your unborn baby (fetus) stay as healthy as possible. Prenatal care may be provided by a midwife, a family practice doctor, a mid-level practitioner (nurse practitioner or physician assistant), or a childbirth and pregnancy doctor (obstetrician). How does this affect me? During pregnancy, you will be closely monitored for any new conditions that might develop. To lower your risk of pregnancy complications, you and your health care provider will talk about any underlying conditions you have. How does this affect my baby? Early and consistent prenatal care increases the chance that your baby will be healthy during pregnancy. Prenatal care lowers the risk that your baby will be: Born early (prematurely). Smaller than expected at birth (small for gestational age). What can I expect at the first prenatal care visit? Your first prenatal care visit will likely be the longest. You should schedule your first prenatal care visit as soon as you know that you are pregnant. Your first visit is a good time to talk about any questions or concerns you have about pregnancy. Medical history At your visit, you and your health care provider will talk about your medical history, including: Any past pregnancies. Your family's medical history. Medical history of the baby's father. Any long-term (chronic) health conditions you have and how you manage them. Any surgeries or procedures you have had. Any current over-the-counter or prescription medicines, herbs, or supplements that you are taking. Other factors that could pose a risk to your baby, including: Exposure to harmful chemicals or radiation at work or at home. Any substance use, including tobacco, alcohol, and drug use. Your home setting and your stress levels, including: Exposure to abuse or violence. Household financial strain. Your daily health habits, including diet and  exercise. Tests and screenings Your health care provider will: Measure your weight, height, and blood pressure. Do a physical exam, including a pelvic and breast exam. Perform blood tests and urine tests to check for: Urinary tract infection. Sexually transmitted infections (STIs). Low iron levels in your blood (anemia). Blood type and certain proteins on red blood cells (Rh antibodies). Infections and immunity to viruses, such as hepatitis B and rubella. HIV (human immunodeficiency virus). Discuss your options for genetic screening. Tips about staying healthy Your health care provider will also give you information about how to keep yourself and your baby healthy, including: Nutrition and taking vitamins. Physical activity. How to manage pregnancy symptoms such as nausea and vomiting (morning sickness). Infections and substances that may be harmful to your baby and how to avoid them. Food safety. Dental care. Working. Travel. Warning signs to watch for and when to call your health care provider. How often will I have prenatal care visits? After your first prenatal care visit, you will have regular visits throughout your pregnancy. The visit schedule is often as follows: Up to week 28 of pregnancy: once every 4 weeks. 28-36 weeks: once every 2 weeks. After 36 weeks: every week until delivery. Some women may have visits more or less often depending on any underlying health conditions and the health of the baby. Keep all follow-up and prenatal care visits. This is important. What happens during routine prenatal care visits? Your health care provider will: Measure your weight and blood pressure. Check for fetal heart sounds. Measure the height of your uterus in your abdomen (fundal height). This may be measured starting around week 20 of pregnancy. Check the position of your baby inside your uterus. Ask questions   about your diet, sleeping patterns, and whether you can feel the baby  move. Review warning signs to watch for and signs of labor. Ask about any pregnancy symptoms you are having and how you are dealing with them. Symptoms may include: Headaches. Nausea and vomiting. Vaginal discharge. Swelling. Fatigue. Constipation. Changes in your vision. Feeling persistently sad or anxious. Any discomfort, including back or pelvic pain. Bleeding or spotting. Make a list of questions to ask your health care provider at your routine visits. What tests might I have during prenatal care visits? You may have blood, urine, and imaging tests throughout your pregnancy, such as: Urine tests to check for glucose, protein, or signs of infection. Glucose tests to check for a form of diabetes that can develop during pregnancy (gestational diabetes mellitus). This is usually done around week 24 of pregnancy. Ultrasounds to check your baby's growth and development, to check for birth defects, and to check your baby's well-being. These can also help to decide when you should deliver your baby. A test to check for group B strep (GBS) infection. This is usually done around week 36 of pregnancy. Genetic testing. This may include blood, fluid, or tissue sampling, or imaging tests, such as an ultrasound. Some genetic tests are done during the first trimester and some are done during the second trimester. What else can I expect during prenatal care visits? Your health care provider may recommend getting certain vaccines during pregnancy. These may include: A yearly flu shot (annual influenza vaccine). This is especially important if you will be pregnant during flu season. Tdap (tetanus, diphtheria, pertussis) vaccine. Getting this vaccine during pregnancy can protect your baby from whooping cough (pertussis) after birth. This vaccine may be recommended between weeks 27 and 36 of pregnancy. A COVID-19 vaccine. Later in your pregnancy, your health care provider may give you information  about: Childbirth and breastfeeding classes. Choosing a health care provider for your baby. Umbilical cord banking. Breastfeeding. Birth control after your baby is born. The hospital labor and delivery unit and how to set up a tour. Registering at the hospital before you go into labor. Where to find more information Office on Women's Health: womenshealth.gov American Pregnancy Association: americanpregnancy.org March of Dimes: marchofdimes.org Summary Prenatal care helps you and your baby stay as healthy as possible during pregnancy. Your first prenatal care visit will most likely be the longest. You will have visits and tests throughout your pregnancy to monitor your health and your baby's health. Bring a list of questions to your visits to ask your health care provider. Make sure to keep all follow-up and prenatal care visits. This information is not intended to replace advice given to you by your health care provider. Make sure you discuss any questions you have with your health care provider. Document Revised: 11/06/2019 Document Reviewed: 11/06/2019 Elsevier Patient Education  2023 Elsevier Inc.  

## 2022-01-23 NOTE — Addendum Note (Signed)
Addended by: Fonda Kinder on: 01/23/2022 03:49 PM   Modules accepted: Orders

## 2022-01-24 LAB — URINALYSIS, ROUTINE W REFLEX MICROSCOPIC
Bilirubin, UA: NEGATIVE
Glucose, UA: NEGATIVE
Ketones, UA: NEGATIVE
Leukocytes,UA: NEGATIVE
Nitrite, UA: NEGATIVE
RBC, UA: NEGATIVE
Specific Gravity, UA: 1.024 (ref 1.005–1.030)
Urobilinogen, Ur: 1 mg/dL (ref 0.2–1.0)
pH, UA: 8 — ABNORMAL HIGH (ref 5.0–7.5)

## 2022-01-25 LAB — NICOTINE SCREEN, URINE: Cotinine Ql Scrn, Ur: NEGATIVE ng/mL

## 2022-01-25 LAB — MONITOR DRUG PROFILE 14(MW)
Amphetamine Scrn, Ur: NEGATIVE ng/mL
BARBITURATE SCREEN URINE: NEGATIVE ng/mL
BENZODIAZEPINE SCREEN, URINE: NEGATIVE ng/mL
Buprenorphine, Urine: NEGATIVE ng/mL
CANNABINOIDS UR QL SCN: NEGATIVE ng/mL
Cocaine (Metab) Scrn, Ur: NEGATIVE ng/mL
Creatinine(Crt), U: 138.9 mg/dL (ref 20.0–300.0)
Fentanyl, Urine: NEGATIVE pg/mL
Meperidine Screen, Urine: NEGATIVE ng/mL
Methadone Screen, Urine: NEGATIVE ng/mL
OXYCODONE+OXYMORPHONE UR QL SCN: NEGATIVE ng/mL
Opiate Scrn, Ur: NEGATIVE ng/mL
Ph of Urine: 7.5 (ref 4.5–8.9)
Phencyclidine Qn, Ur: NEGATIVE ng/mL
Propoxyphene Scrn, Ur: NEGATIVE ng/mL
SPECIFIC GRAVITY: 1.015
Tramadol Screen, Urine: NEGATIVE ng/mL

## 2022-01-25 LAB — URINE CYTOLOGY ANCILLARY ONLY
Chlamydia: NEGATIVE
Comment: NEGATIVE
Comment: NORMAL
Neisseria Gonorrhea: NEGATIVE

## 2022-01-25 LAB — URINE CULTURE, OB REFLEX

## 2022-01-25 LAB — CULTURE, OB URINE

## 2022-01-30 ENCOUNTER — Other Ambulatory Visit: Payer: Self-pay | Admitting: Physician Assistant

## 2022-01-30 DIAGNOSIS — F411 Generalized anxiety disorder: Secondary | ICD-10-CM

## 2022-02-06 NOTE — L&D Delivery Note (Signed)
Delivery Summary for Pamela Salas  Labor Events:   Preterm labor: No data found  Rupture date: 08/10/2022  Rupture time: 9:32 AM  Rupture type: Artificial Intact  Fluid Color: Green  Induction: No data found  Augmentation: No data found  Complications: No data found  Cervical ripening: No data found No data found   No data found     Delivery:   Episiotomy: No data found  Lacerations: No data found  Repair suture: No data found  Repair # of packets: No data found  Blood loss (ml): 1375   Information for the patient's newborn:  Aariah, Espitia [[id number]]   Delivery 08/10/2022 9:08 PM by  C-Section, Low Transverse Sex:  female Gestational Age: [redacted]w[redacted]d Delivery Clinician:   Living?:         APGARS  One minute Five minutes Ten minutes  Skin color:        Heart rate:        Grimace:        Muscle tone:        Breathing:        Totals: 7  9      Presentation/position:      Resuscitation:   Cord information:    Disposition of cord blood:     Blood gases sent?  Complications:   Placenta: Delivered:       appearance Newborn Measurements: Weight: 10 lb 3.3 oz (4630 g)  Height: 22.05"  Head circumference:    Chest circumference:    Other providers:    Additional  information: Forceps:   Vacuum:   Breech:   Observed anomalies      See Dr. Oretha Milch operative note for details of C-section procedure.    Hildred Laser, MD [facility]

## 2022-02-16 ENCOUNTER — Encounter: Payer: Self-pay | Admitting: Certified Nurse Midwife

## 2022-02-20 ENCOUNTER — Ambulatory Visit (INDEPENDENT_AMBULATORY_CARE_PROVIDER_SITE_OTHER): Payer: BC Managed Care – PPO | Admitting: Certified Nurse Midwife

## 2022-02-20 ENCOUNTER — Encounter: Payer: Self-pay | Admitting: Certified Nurse Midwife

## 2022-02-20 VITALS — BP 127/87 | HR 103 | Wt 187.0 lb

## 2022-02-20 DIAGNOSIS — Z3A15 15 weeks gestation of pregnancy: Secondary | ICD-10-CM

## 2022-02-20 DIAGNOSIS — Z3482 Encounter for supervision of other normal pregnancy, second trimester: Secondary | ICD-10-CM

## 2022-02-20 LAB — POCT URINALYSIS DIPSTICK OB
Bilirubin, UA: NEGATIVE
Blood, UA: NEGATIVE
Glucose, UA: NEGATIVE
Ketones, UA: NEGATIVE
Leukocytes, UA: NEGATIVE
Nitrite, UA: NEGATIVE
POC,PROTEIN,UA: NEGATIVE
Spec Grav, UA: 1.025 (ref 1.010–1.025)
Urobilinogen, UA: 0.2 E.U./dL — AB
pH, UA: 6.5 (ref 5.0–8.0)

## 2022-02-20 NOTE — Progress Notes (Signed)
ROB doing well, starting to feel a little better , the nausea has decreased. She is not feeling movement yet. Reassurance given. Discussed anatomy u/s next visit. She agrees .   ROB 4 wks with anatomy scan.    Philip Aspen, CNM

## 2022-02-20 NOTE — Addendum Note (Signed)
Addended by: Landis Gandy on: 02/20/2022 10:58 AM   Modules accepted: Orders

## 2022-02-20 NOTE — Patient Instructions (Signed)
Round Ligament Pain  The round ligaments are a pair of cord-like tissues that help support the uterus. They can become a source of pain during pregnancy as the ligaments soften and stretch as the baby grows. The pain usually begins in the second trimester (13-28 weeks) of pregnancy, and should only last for a few seconds when it occurs. However, the pain can come and go until the baby is delivered. The pain does not cause harm to the baby. Round ligament pain is usually a short, sharp, and pinching pain, but it can also be a dull, lingering, and aching pain. The pain is felt in the lower side of the abdomen or in the groin. It usually starts deep in the groin and moves up to the outside of the hip area. The pain may happen when you: Suddenly change position, such as quickly going from a sitting to standing position. Do physical activity. Cough or sneeze. Follow these instructions at home: Managing pain  When the pain starts, relax. Then, try any of these methods to help with the pain: Sit down. Flex your knees up to your abdomen. Lie on your side with one pillow under your abdomen and another pillow between your legs. Sit in a warm bath for 15-20 minutes or until the pain goes away. General instructions Watch your condition for any changes. Move slowly when you sit down or stand up. Stop or reduce your physical activities if they cause pain. Avoid long walks if they cause pain. Take over-the-counter and prescription medicines only as told by your health care provider. Keep all follow-up visits. This is important. Contact a health care provider if: Your pain does not go away with treatment. You feel pain in your back that you did not have before. Your medicine is not helping. You have a fever or chills. You have nausea or vomiting. You have diarrhea. You have pain when you urinate. Get help right away if: You have pain that is a rhythmic, cramping pain similar to labor pains. Labor  pains are usually 2 minutes apart, last for about 1 minute, and involve a bearing down feeling or pressure in your pelvis. You have vaginal bleeding. These symptoms may represent a serious problem that is an emergency. Do not wait to see if the symptoms will go away. Get medical help right away. Call your local emergency services (911 in the [place] Do not drive yourself to the hospital. Summary Round ligament pain is felt in the lower abdomen or groin. This pain usually begins in the second trimester (13-28 weeks) and should only last for a few seconds when it occurs. You may notice the pain when you suddenly change position, when you cough or sneeze, or during physical activity. Relaxing, flexing your knees to your abdomen, lying on one side, or taking a warm bath may help to get rid of the pain. Contact your health care provider if the pain does not go away. This information is not intended to replace advice given to you by your health care provider. Make sure you discuss any questions you have with your health care provider. Document Revised: 04/07/2020 Document Reviewed: 04/07/2020 Elsevier Patient Education  2023 Elsevier Inc.  

## 2022-02-24 ENCOUNTER — Encounter: Payer: Self-pay | Admitting: Physician Assistant

## 2022-02-24 ENCOUNTER — Ambulatory Visit: Payer: BC Managed Care – PPO | Admitting: Physician Assistant

## 2022-02-24 VITALS — BP 126/79 | HR 113 | Temp 97.8°F | Ht 61.0 in | Wt 185.0 lb

## 2022-02-24 DIAGNOSIS — R051 Acute cough: Secondary | ICD-10-CM | POA: Diagnosis not present

## 2022-02-24 DIAGNOSIS — R000 Tachycardia, unspecified: Secondary | ICD-10-CM | POA: Diagnosis not present

## 2022-02-24 DIAGNOSIS — J069 Acute upper respiratory infection, unspecified: Secondary | ICD-10-CM | POA: Diagnosis not present

## 2022-02-24 LAB — POCT INFLUENZA A/B
Influenza A, POC: NEGATIVE
Influenza B, POC: NEGATIVE

## 2022-02-24 LAB — POC COVID19 BINAXNOW: SARS Coronavirus 2 Ag: NEGATIVE

## 2022-02-24 NOTE — Progress Notes (Signed)
Established patient visit   Patient: Pamela Salas   DOB: 2000/05/18   22 y.o. Female  MRN: [id number] Visit Date: 02/24/2022  Today's healthcare provider: [name], PA-C   Cc. Nasal congestion, low grade fever, body aches, high heart rate x 1 day  Subjective    HPI  Pt is a 22 y/o female who is 56 w pregnant who presents today with symptoms of a cough, nasal congestion, fever with a t max of 35F , one episode of vomiting, high heart rate, body aches and a sore throat x 1 day. Reports she had similar symptoms  Last week that resolved.  She saw a resting heart rate of 135 last night.   Medications: Outpatient Medications Prior to Visit  Medication Sig   aspirin EC 81 MG tablet Take 1 tablet (81 mg total) by mouth daily. Swallow whole. Start at 14 weeks daily   buPROPion (WELLBUTRIN SR) 150 MG 12 hr tablet TAKE 1 TABLET BY MOUTH EVERY DAY   Prenatal Vit-Fe Fumarate-FA (MULTIVITAMIN-PRENATAL) 27-0.8 MG TABS tablet Take 1 tablet by mouth daily at 12 noon.   sertraline (ZOLOFT) 50 MG tablet TAKE 1 TABLET BY MOUTH EVERY DAY   No facility-administered medications prior to visit.    Review of Systems  Constitutional:  Positive for fatigue. Negative for fever.  HENT:  Positive for congestion, postnasal drip, rhinorrhea and sore throat.   Respiratory:  Positive for cough. Negative for shortness of breath.   Cardiovascular:  Negative for chest pain and leg swelling.  Gastrointestinal:  Negative for abdominal pain.  Neurological:  Negative for dizziness and headaches.      Objective    BP 126/79   Pulse (!) 113   Temp 97.8 F (36.6 C)   Ht 5\' 1"  (1.549 m)   Wt 185 lb (83.9 kg)   LMP 11/05/2021 (Exact Date)   SpO2 100%   BMI 34.96 kg/m   Physical Exam Constitutional:      General: She is awake.     Appearance: She is well-developed.  HENT:     Head: Normocephalic.     Mouth/Throat:     Pharynx: Posterior oropharyngeal erythema present. No oropharyngeal  exudate.  Eyes:     Conjunctiva/sclera: Conjunctivae normal.  Cardiovascular:     Rate and Rhythm: Regular rhythm. Tachycardia present.     Heart sounds: Normal heart sounds.  Pulmonary:     Effort: Pulmonary effort is normal.     Breath sounds: Normal breath sounds. No wheezing, rhonchi or rales.  Skin:    General: Skin is warm.  Neurological:     Mental Status: She is alert and oriented to person, place, and time.  Psychiatric:        Attention and Perception: Attention normal.        Mood and Affect: Mood normal.        Speech: Speech normal.        Behavior: Behavior is cooperative.     Results for orders placed or performed in visit on 02/24/22  POC COVID-19  Result Value Ref Range   SARS Coronavirus 2 Ag Negative Negative  POCT Influenza A/B  Result Value Ref Range   Influenza A, POC Negative Negative   Influenza B, POC Negative Negative    Assessment & Plan     URI Poc covid and flu a/b negative Encouraged to increase fluids, add in electrolytes Tylenol and mucinex OTC If symptoms worsen, advise urgent care/ed.  2. Tachycardia Highest of 118 bpm in office, resting around 110-113. Advised likely 2/2 to illness, dehydration.  If heart rate gets to 130+ and does not come down advised pt to go to ED  Return if symptoms worsen or fail to improve.      I, [name], PA-C have reviewed all documentation for this visit. The documentation on  02/24/22 for the exam, diagnosis, procedures, and orders are all accurate and complete.  [name], PA-C [facility] [address] [place], [place], [address] Office: [phone] Fax: [place]

## 2022-02-27 ENCOUNTER — Encounter: Payer: Self-pay | Admitting: Physician Assistant

## 2022-02-28 ENCOUNTER — Other Ambulatory Visit: Payer: Self-pay | Admitting: Physician Assistant

## 2022-02-28 MED ORDER — AMOXICILLIN 875 MG PO TABS: 875.0000 mg | ORAL_TABLET | Freq: Two times a day (BID) | ORAL | 0 refills | Status: AC

## 2022-03-14 ENCOUNTER — Telehealth: Payer: Self-pay | Admitting: Obstetrics & Gynecology

## 2022-03-14 NOTE — Telephone Encounter (Signed)
I contacted patient via phone. I left message about no ultrasound and gave patient new location for her appointment scheduled for 2/13. Patient is scheduled for [facility] at 3 pm, she will need to arrive at 2:45 with a Full bladder. My chart message will be sent

## 2022-03-15 NOTE — Telephone Encounter (Signed)
Patient called to confirm 2/13 imaging at [facility] and office visit is now 2/16 with MMF

## 2022-03-21 ENCOUNTER — Other Ambulatory Visit: Payer: BC Managed Care – PPO

## 2022-03-21 ENCOUNTER — Encounter: Payer: Self-pay | Admitting: Certified Nurse Midwife

## 2022-03-21 ENCOUNTER — Encounter: Payer: BC Managed Care – PPO | Admitting: Obstetrics & Gynecology

## 2022-03-21 ENCOUNTER — Ambulatory Visit
Admission: RE | Admit: 2022-03-21 | Discharge: 2022-03-21 | Disposition: A | Discharge status: Home / Self Care | Payer: BC Managed Care – PPO | Source: Ambulatory Visit | Attending: Certified Nurse Midwife | Admitting: Certified Nurse Midwife

## 2022-03-21 ENCOUNTER — Other Ambulatory Visit: Payer: Self-pay | Admitting: Certified Nurse Midwife

## 2022-03-21 DIAGNOSIS — Z3A19 19 weeks gestation of pregnancy: Secondary | ICD-10-CM | POA: Diagnosis not present

## 2022-03-21 DIAGNOSIS — Z3689 Encounter for other specified antenatal screening: Secondary | ICD-10-CM | POA: Diagnosis not present

## 2022-03-21 DIAGNOSIS — Z3A20 20 weeks gestation of pregnancy: Secondary | ICD-10-CM

## 2022-03-21 DIAGNOSIS — Z3A15 15 weeks gestation of pregnancy: Secondary | ICD-10-CM

## 2022-03-21 DIAGNOSIS — Z369 Encounter for antenatal screening, unspecified: Secondary | ICD-10-CM | POA: Diagnosis not present

## 2022-03-24 ENCOUNTER — Ambulatory Visit (INDEPENDENT_AMBULATORY_CARE_PROVIDER_SITE_OTHER): Payer: BC Managed Care – PPO | Admitting: Obstetrics

## 2022-03-24 VITALS — BP 122/68 | HR 97 | Wt 188.8 lb

## 2022-03-24 DIAGNOSIS — Z3480 Encounter for supervision of other normal pregnancy, unspecified trimester: Secondary | ICD-10-CM

## 2022-03-24 DIAGNOSIS — Z3482 Encounter for supervision of other normal pregnancy, second trimester: Secondary | ICD-10-CM

## 2022-03-24 DIAGNOSIS — Z3A19 19 weeks gestation of pregnancy: Secondary | ICD-10-CM

## 2022-03-24 LAB — POCT URINALYSIS DIPSTICK OB
Bilirubin, UA: NEGATIVE
Blood, UA: NEGATIVE
Glucose, UA: NEGATIVE
Leukocytes, UA: NEGATIVE
Nitrite, UA: NEGATIVE
Spec Grav, UA: 1.015 (ref 1.010–1.025)
Urobilinogen, UA: 0.2 E.U./dL
pH, UA: 6.5 (ref 5.0–8.0)

## 2022-03-24 NOTE — Progress Notes (Signed)
ROB [redacted]w[redacted]d She has been feeling little flutters. She is doing well. She has no new concerns today.

## 2022-03-24 NOTE — Progress Notes (Signed)
Routine Prenatal Care Visit  Subjective  Pamela Salas is a 22 y.o. G1P0 at 4w6dbeing seen today for ongoing prenatal care.  She is currently monitored for the following issues for this low-risk pregnancy and has Hydrosalpinx; Ovarian retention cyst; GAD (generalized anxiety disorder); Elevated blood-pressure reading without diagnosis of hypertension; Sexual dysfunction; and Supervision of other normal pregnancy, antepartum on their problem list.  ----------------------------------------------------------------------------------- Patient reports no complaints.  She is having a girl, named [name] Contractions: Not present. Vag. Bleeding: None.  Movement: Absent. Leaking Fluid denies.  ----------------------------------------------------------------------------------- The following portions of the patient's history were reviewed and updated as appropriate: allergies, current medications, past family history, past medical history, past social history, past surgical history and problem list. Problem list updated.  Objective  Blood pressure 122/68, pulse 97, weight 188 lb 12.8 oz (85.6 kg), last menstrual period 11/05/2021. Pregravid weight 180 lb (81.6 kg) Total Weight Gain 8 lb 12.8 oz (3.992 kg) Urinalysis: Urine Protein    Urine Glucose    Fetal Status:     Movement: Absent     General:  Alert, oriented and cooperative. Patient is in no acute distress.  Skin: Skin is warm and dry. No rash noted.   Cardiovascular: Normal heart rate noted  Respiratory: Normal respiratory effort, no problems with respiration noted  Abdomen: Soft, gravid, appropriate for gestational age. Pain/Pressure: Absent     Pelvic:  Cervical exam deferred        Extremities: Normal range of motion.  Edema: None  Mental Status: Normal mood and affect. Normal behavior. Normal judgment and thought content.   Assessment   22y.o. G1P0 at 174w6dy  08/12/2022, by Last Menstrual Period presenting for routine prenatal  visit Declines the AFP testing. Will need f/u sono to complete anatomy scan  Plan   first Problems (from 12/23/21 to present)     Problem Noted Resolved   Supervision of other normal pregnancy, antepartum 12/23/2021 by JoCleophas DunkerCMA No   Overview Addendum 12/23/2021  1:37 PM by JoCleophas DunkerCMLos Ranchos de [place]ff Provider  Office Location  [facility] Dating  Not found.  Language  English Anatomy [place]  Flu Vaccine  offer Genetic Screen  NIPS:   TDaP vaccine   offer Hgb A1C or  GTT Early : Third trimester :   Covid declined   LAB RESULTS   Rhogam     Blood Type     Feeding Plan breast Antibody    Contraception undecided Rubella    Circumcision yes RPR     Pediatrician  undecided HBsAg     Support Person AuLiane ComberIV Non Reactive (04/28 1519)  Prenatal Classes no Varicella     GBS  (For PCN allergy, check sensitivities)   BTL Consent  Hep C     VBAC Consent  Pap Diagnosis  Date Value Ref Range Status  06/03/2021   Final   - Negative for intraepithelial lesion or malignancy (NILM)      Hgb Electro      CF      SMA                   Preterm labor symptoms and general obstetric precautions including but not limited to vaginal bleeding, contractions, leaking of fluid and fetal movement were reviewed in detail with the patient. Please refer to After Visit Summary for other counseling recommendations.  She has not yet heard about when her repeat anatomy scan  will be done.  No follow-ups on file.  Imagene Riches, CNM  03/24/2022 3:03 PM

## 2022-04-03 ENCOUNTER — Other Ambulatory Visit: Payer: BC Managed Care – PPO

## 2022-04-06 ENCOUNTER — Ambulatory Visit
Admission: RE | Admit: 2022-04-06 | Discharge: 2022-04-06 | Disposition: A | Discharge status: Home / Self Care | Payer: BC Managed Care – PPO | Source: Ambulatory Visit | Attending: Certified Nurse Midwife | Admitting: Certified Nurse Midwife

## 2022-04-06 DIAGNOSIS — Z3A20 20 weeks gestation of pregnancy: Secondary | ICD-10-CM | POA: Diagnosis not present

## 2022-04-06 DIAGNOSIS — Z3A21 21 weeks gestation of pregnancy: Secondary | ICD-10-CM | POA: Diagnosis not present

## 2022-04-06 DIAGNOSIS — Z3689 Encounter for other specified antenatal screening: Secondary | ICD-10-CM | POA: Insufficient documentation

## 2022-04-24 ENCOUNTER — Encounter: Payer: Self-pay | Admitting: Advanced Practice Midwife

## 2022-04-24 ENCOUNTER — Ambulatory Visit (INDEPENDENT_AMBULATORY_CARE_PROVIDER_SITE_OTHER): Payer: BC Managed Care – PPO | Admitting: Advanced Practice Midwife

## 2022-04-24 VITALS — BP 133/84 | HR 113 | Wt 192.9 lb

## 2022-04-24 DIAGNOSIS — Z3482 Encounter for supervision of other normal pregnancy, second trimester: Secondary | ICD-10-CM

## 2022-04-24 DIAGNOSIS — Z369 Encounter for antenatal screening, unspecified: Secondary | ICD-10-CM

## 2022-04-24 DIAGNOSIS — Z113 Encounter for screening for infections with a predominantly sexual mode of transmission: Secondary | ICD-10-CM

## 2022-04-24 DIAGNOSIS — Z130 Encounter for screening for diseases of the blood and blood-forming organs and certain disorders involving the immune mechanism: Secondary | ICD-10-CM

## 2022-04-24 DIAGNOSIS — Z131 Encounter for screening for diabetes mellitus: Secondary | ICD-10-CM

## 2022-04-24 DIAGNOSIS — Z3A24 24 weeks gestation of pregnancy: Secondary | ICD-10-CM

## 2022-04-24 LAB — POCT URINALYSIS DIPSTICK
Bilirubin, UA: NEGATIVE
Blood, UA: NEGATIVE
Glucose, UA: NEGATIVE
Ketones, UA: NEGATIVE
Leukocytes, UA: NEGATIVE
Nitrite, UA: NEGATIVE
Protein, UA: NEGATIVE
Spec Grav, UA: 1.02 (ref 1.010–1.025)
Urobilinogen, UA: 0.2 E.U./dL
pH, UA: 7 (ref 5.0–8.0)

## 2022-04-24 NOTE — Progress Notes (Addendum)
Routine Prenatal Care Visit  Subjective  Pamela Salas is a 22 y.o. G1P0 at [redacted]w[redacted]d being seen today for ongoing prenatal care.  She is currently monitored for the following issues for this low-risk pregnancy and has Hydrosalpinx; Ovarian retention cyst; GAD (generalized anxiety disorder); Sexual dysfunction; and Supervision of other normal pregnancy, antepartum on their problem list.  ----------------------------------------------------------------------------------- Patient reports back pain that shoots down her leg. Denies headaches, blurred vision, and abdominal pain. Contractions: Not present. Vag. Bleeding: None.  Movement: Present. Leaking Fluid denies.  ----------------------------------------------------------------------------------- The following portions of the patient's history were reviewed and updated as appropriate: allergies, current medications, past family history, past medical history, past social history, past surgical history and problem list. Problem list updated.  Objective  Blood pressure 133/84, pulse (!) 113, weight 192 lb 14.4 oz (87.5 kg), last menstrual period 11/05/2021. Pregravid weight 180 lb (81.6 kg) Total Weight Gain 12 lb 14.4 oz (5.851 kg) Urinalysis: Urine Protein    Urine Glucose    Fetal Status: Fetal Heart Rate (bpm): 145 Fundal Height: 24 cm Movement: Present     General:  Alert, oriented and cooperative. Patient is in no acute distress.  Skin: Skin is warm and dry. No rash noted.   Cardiovascular: Normal heart rate noted  Respiratory: Normal respiratory effort, no problems with respiration noted  Abdomen: Soft, gravid, appropriate for gestational age. Pain/Pressure: Absent     Pelvic:  Cervical exam deferred        Extremities: Normal range of motion.  Edema: None  Mental Status: Normal mood and affect. Normal behavior. Normal judgment and thought content.   Assessment   22 y.o. G1P0 at [redacted]w[redacted]d by  08/12/2022, by Last Menstrual Period presenting for  routine prenatal visit  Plan   first Problems (from 12/23/21 to present)     Problem Noted Resolved   Supervision of other normal pregnancy, antepartum 12/23/2021 by Cleophas Dunker, CMA No   Overview Addendum 04/24/2022  1:41 PM by Rod Can, CNM     Clinical Staff Provider  Office Location  [facility] Dating  Not found.  Language  English Anatomy US    Flu Vaccine  offer Genetic Screen  NIPS:   TDaP vaccine   offer Hgb A1C or  GTT Early : Third trimester :   Covid declined   LAB RESULTS   Rhogam  O/Positive/-- (12/11 1423)  Blood Type O/Positive/-- (12/11 1423)   Feeding Plan breast Antibody Negative (12/11 1423)  Contraception undecided Rubella 3.95 (12/11 1423)  Circumcision yes RPR Non Reactive (12/11 1423)   Pediatrician  undecided HBsAg Negative (12/11 1423)   Support Person Liane Comber HIV Non Reactive (12/11 1423)  Prenatal Classes no Varicella     GBS  (For PCN allergy, check sensitivities)   BTL Consent  Hep C Non Reactive (12/11 1423)   VBAC Consent  Pap Diagnosis  Date Value Ref Range Status  06/03/2021   Final   - Negative for intraepithelial lesion or malignancy (NILM)      Hgb Electro      CF      SMA                   Preterm labor symptoms and general obstetric precautions including but not limited to vaginal bleeding, contractions, leaking of fluid and fetal movement were reviewed in detail with the patient.  Discussed maternity band, supportive shoes with compression socks, massage, and heat/ice therapy. Patient plans to travel to the beach before the baby  is born. Educated on getting out of the car and walking every 2 hours, wearing compression socks for the journey, and moving feet around in the car between stops. Discussed ways to maintain a healthy diet including getting vegetables in using creative methods such as including them in a larger dish to mask the taste/texture. Patient states desire to eat healthier, but expresses food aversions that  make that difficult. Educated patient on reasoning behind aspirin therapy for pregnant patients who have had high blood pressures and are overweight. Patient is currently not taking Wellbutrin or Zoloft and states that her mood is good and anxiety is under control. Discussed that she can go back on her medications if her mental health declines during the pregnancy.  Please refer to After Visit Summary for other counseling recommendations. Discussed upcoming 28 week labs.  Patient seen by CNM and SCNM, Pamela Salas   Return in about 4 weeks (around 05/22/2022) for 28 wk labs and rob.  Rod Can, CNM 04/24/2022 1:41 PM

## 2022-04-24 NOTE — Patient Instructions (Signed)

## 2022-05-09 ENCOUNTER — Other Ambulatory Visit: Payer: Self-pay | Admitting: Family Medicine

## 2022-05-09 DIAGNOSIS — F411 Generalized anxiety disorder: Secondary | ICD-10-CM

## 2022-05-22 ENCOUNTER — Other Ambulatory Visit: Payer: Self-pay | Admitting: Physician Assistant

## 2022-05-22 DIAGNOSIS — F411 Generalized anxiety disorder: Secondary | ICD-10-CM

## 2022-05-24 ENCOUNTER — Ambulatory Visit (INDEPENDENT_AMBULATORY_CARE_PROVIDER_SITE_OTHER): Payer: BC Managed Care – PPO | Admitting: Obstetrics

## 2022-05-24 ENCOUNTER — Other Ambulatory Visit: Payer: BC Managed Care – PPO

## 2022-05-24 VITALS — BP 126/79 | HR 105 | Wt 195.1 lb

## 2022-05-24 DIAGNOSIS — Z131 Encounter for screening for diabetes mellitus: Secondary | ICD-10-CM

## 2022-05-24 DIAGNOSIS — Z3403 Encounter for supervision of normal first pregnancy, third trimester: Secondary | ICD-10-CM

## 2022-05-24 DIAGNOSIS — Z23 Encounter for immunization: Secondary | ICD-10-CM | POA: Diagnosis not present

## 2022-05-24 DIAGNOSIS — Z3482 Encounter for supervision of other normal pregnancy, second trimester: Secondary | ICD-10-CM | POA: Diagnosis not present

## 2022-05-24 DIAGNOSIS — Z113 Encounter for screening for infections with a predominantly sexual mode of transmission: Secondary | ICD-10-CM | POA: Diagnosis not present

## 2022-05-24 DIAGNOSIS — O163 Unspecified maternal hypertension, third trimester: Secondary | ICD-10-CM

## 2022-05-24 DIAGNOSIS — Z130 Encounter for screening for diseases of the blood and blood-forming organs and certain disorders involving the immune mechanism: Secondary | ICD-10-CM | POA: Diagnosis not present

## 2022-05-24 DIAGNOSIS — Z3A28 28 weeks gestation of pregnancy: Secondary | ICD-10-CM

## 2022-05-24 DIAGNOSIS — Z369 Encounter for antenatal screening, unspecified: Secondary | ICD-10-CM

## 2022-05-24 DIAGNOSIS — Z3480 Encounter for supervision of other normal pregnancy, unspecified trimester: Secondary | ICD-10-CM

## 2022-05-24 LAB — POCT URINALYSIS DIPSTICK OB
Bilirubin, UA: NEGATIVE
Blood, UA: NEGATIVE
Glucose, UA: NEGATIVE
Ketones, UA: NEGATIVE
Leukocytes, UA: NEGATIVE
Nitrite, UA: NEGATIVE
POC,PROTEIN,UA: NEGATIVE
Spec Grav, UA: 1.015
Urobilinogen, UA: 0.2 U/dL
pH, UA: 6.5

## 2022-05-24 NOTE — Progress Notes (Addendum)
Routine Prenatal Care Visit  Subjective  Pamela Salas is a 22 y.o. G1P0 at [redacted]w[redacted]d being seen today for ongoing prenatal care.  She is currently monitored for the following issues for this low-risk pregnancy and has Hydrosalpinx; Ovarian retention cyst; GAD (generalized anxiety disorder); Sexual dysfunction; and Supervision of other normal pregnancy, antepartum on their problem list.  ----------------------------------------------------------------------------------- Patient reports fatigue.   Contractions: Not present. Vag. Bleeding: None.  Movement: Present. Leaking Fluid denies.  ----------------------------------------------------------------------------------- The following portions of the patient's history were reviewed and updated as appropriate: allergies, current medications, past family history, past medical history, past social history, past surgical history and problem list. Problem list updated.  Objective  Blood pressure 126/79, pulse (!) 105, weight 195 lb 1.6 oz (88.5 kg), last menstrual period 11/05/2021. Pregravid weight 180 lb (81.6 kg) Total Weight Gain 15 lb 1.6 oz (6.849 kg) Urinalysis: Urine Protein Negative  Urine Glucose Negative  Fetal Status: Fetal Heart Rate (bpm): 155 Fundal Height: 29 cm Movement: Present     General:  Alert, oriented and cooperative. Patient is in no acute distress.  Skin: Skin is warm and dry. No rash noted.   Cardiovascular: Normal heart rate noted  Respiratory: Normal respiratory effort, no problems with respiration noted  Abdomen: Soft, gravid, appropriate for gestational age. Pain/Pressure: Absent     Pelvic:  Cervical exam deferred        Extremities: Normal range of motion.  Edema: None  Mental Status: Normal mood and affect. Normal behavior. Normal judgment and thought content.   Assessment   22 y.o. G1P0 at [redacted]w[redacted]d by  08/12/2022, by Last Menstrual Period presenting for routine prenatal visit  Plan   first Problems (from 12/23/21  to present)     Problem Noted Resolved   Supervision of other normal pregnancy, antepartum 12/23/2021 by Loran Senters, CMA No   Overview Addendum 05/24/2022  9:23 AM by Mirna Mires, CNM     Clinical Staff Provider  Office Location  [facility] Dating  Not found.  Language  English Anatomy US  Normal female  Flu Vaccine  offer Genetic Screen  NIPS: negative, xx  TDaP vaccine   offer Hgb A1C or  GTT Early : Third trimester :   Covid declined   LAB RESULTS   Rhogam  O/Positive/-- (12/11 1423)  Blood Type O/Positive/-- (12/11 1423)   Feeding Plan breast Antibody Negative (12/11 1423)  Contraception undecided Rubella 3.95 (12/11 1423)  Circumcision yes RPR Non Reactive (12/11 1423)   Pediatrician  undecided HBsAg Negative (12/11 1423)   Support Person Eliberto Ivory HIV Non Reactive (12/11 1423)  Prenatal Classes no Varicella     GBS  (For PCN allergy, check sensitivities)   BTL Consent  Hep C Non Reactive (12/11 1423)   VBAC Consent  Pap Diagnosis  Date Value Ref Range Status  06/03/2021   Final   - Negative for intraepithelial lesion or malignancy (NILM)      Hgb Electro      CF      SMA                   Preterm labor symptoms and general obstetric precautions including but not limited to vaginal bleeding, contractions, leaking of fluid and fetal movement were reviewed in detail with the patient.  Discussed high blood pressure today, second pressure taken was normal, labs for PIH done today. Will follow-up based on labs. Discussed preeclampsia symptoms and when she should come in. Mood doing well;  discussed keeping track of her moods and depression/anxiety symptoms. Educated patient that there are medications for mood disorders that are safe to take in pregnancy and that she should talk to [place] if she wants to restart her medications. Patient expressed interest in transferring care to [facility], undecided as of today. Enjoys seeing the midwives here and is hesitant to transfer  care.  Please refer to After Visit Summary for other counseling recommendations.   Return in about 2 weeks (around 06/07/2022) for return OB. Earlie Counts, SNM Mirna Mires, CNM  05/24/2022 11:33 AM

## 2022-05-25 ENCOUNTER — Encounter: Payer: Self-pay | Admitting: Obstetrics

## 2022-05-25 LAB — 28 WEEK RH+PANEL
Basophils Absolute: 0 10*3/uL (ref 0.0–0.2)
Basos: 0 %
EOS (ABSOLUTE): 0 10*3/uL (ref 0.0–0.4)
Eos: 0 %
Gestational Diabetes Screen: 137 mg/dL (ref 70–139)
HIV Screen 4th Generation wRfx: NONREACTIVE
Hematocrit: 35.3 % (ref 34.0–46.6)
Hemoglobin: 11.1 g/dL (ref 11.1–15.9)
Immature Grans (Abs): 0.1 10*3/uL (ref 0.0–0.1)
Immature Granulocytes: 1 %
Lymphocytes Absolute: 1 10*3/uL (ref 0.7–3.1)
Lymphs: 10 %
MCH: 26.5 pg — ABNORMAL LOW (ref 26.6–33.0)
MCHC: 31.4 g/dL — ABNORMAL LOW (ref 31.5–35.7)
MCV: 84 fL (ref 79–97)
Monocytes Absolute: 0.6 10*3/uL (ref 0.1–0.9)
Monocytes: 6 %
Neutrophils Absolute: 8.6 10*3/uL — ABNORMAL HIGH (ref 1.4–7.0)
Neutrophils: 83 %
Platelets: 234 10*3/uL (ref 150–450)
RBC: 4.19 x10E6/uL (ref 3.77–5.28)
RDW: 13.6 % (ref 11.7–15.4)
RPR Ser Ql: NONREACTIVE
WBC: 10.4 10*3/uL (ref 3.4–10.8)

## 2022-06-08 ENCOUNTER — Encounter: Payer: BC Managed Care – PPO | Admitting: Obstetrics and Gynecology

## 2022-06-13 ENCOUNTER — Ambulatory Visit (INDEPENDENT_AMBULATORY_CARE_PROVIDER_SITE_OTHER): Payer: BC Managed Care – PPO | Admitting: Obstetrics and Gynecology

## 2022-06-13 ENCOUNTER — Encounter: Payer: Self-pay | Admitting: Obstetrics and Gynecology

## 2022-06-13 VITALS — BP 122/81 | HR 103 | Wt 199.6 lb

## 2022-06-13 DIAGNOSIS — Z3403 Encounter for supervision of normal first pregnancy, third trimester: Secondary | ICD-10-CM

## 2022-06-13 DIAGNOSIS — Z3A31 31 weeks gestation of pregnancy: Secondary | ICD-10-CM

## 2022-06-13 LAB — POCT URINALYSIS DIPSTICK OB
Bilirubin, UA: NEGATIVE
Blood, UA: NEGATIVE
Glucose, UA: NEGATIVE
Ketones, UA: NEGATIVE
Leukocytes, UA: NEGATIVE
Nitrite, UA: NEGATIVE
Spec Grav, UA: 1.015 (ref 1.010–1.025)
Urobilinogen, UA: 0.2 E.U./dL
pH, UA: 6.5 (ref 5.0–8.0)

## 2022-06-13 NOTE — Progress Notes (Signed)
ROB. She states daily fetal movement, denies pelvic pain or pressure. Patient states no questions or concerns at this time.

## 2022-06-13 NOTE — Progress Notes (Signed)
ROB: Has occasional nausea/indigestion after eating.  Denies pain.  Has not tried any antacids.  Reports daily fetal movement.  Blood pressure fine today.  Baby currently active

## 2022-06-26 ENCOUNTER — Ambulatory Visit (INDEPENDENT_AMBULATORY_CARE_PROVIDER_SITE_OTHER): Payer: BC Managed Care – PPO | Admitting: Obstetrics

## 2022-06-26 VITALS — BP 131/87 | HR 103 | Wt 202.0 lb

## 2022-06-26 DIAGNOSIS — Z3403 Encounter for supervision of normal first pregnancy, third trimester: Secondary | ICD-10-CM

## 2022-06-26 DIAGNOSIS — Z3A33 33 weeks gestation of pregnancy: Secondary | ICD-10-CM

## 2022-06-26 LAB — POCT URINALYSIS DIPSTICK OB
Bilirubin, UA: NEGATIVE
Blood, UA: NEGATIVE
Glucose, UA: NEGATIVE
Leukocytes, UA: NEGATIVE
Nitrite, UA: NEGATIVE
POC,PROTEIN,UA: NEGATIVE
Spec Grav, UA: 1.02 (ref 1.010–1.025)
Urobilinogen, UA: 1 E.U./dL
pH, UA: 6 (ref 5.0–8.0)

## 2022-06-26 NOTE — Progress Notes (Signed)
Routine Prenatal Care Visit  Subjective  Pamela Salas is a 22 y.o. G1P0 at [redacted]w[redacted]d being seen today for ongoing prenatal care.  She is currently monitored for the following issues for this low-risk pregnancy and has Hydrosalpinx; Ovarian retention cyst; GAD (generalized anxiety disorder); Sexual dysfunction; and Supervision of other normal pregnancy, antepartum on their problem list.  ----------------------------------------------------------------------------------- Patient reports no complaints.  Her mood is good per her and FOB's report. Does not want to restart on SSRIs. Contractions: Not present. Vag. Bleeding: None.  Movement: Present. Leaking Fluid denies.  ----------------------------------------------------------------------------------- The following portions of the patient's history were reviewed and updated as appropriate: allergies, current medications, past family history, past medical history, past social history, past surgical history and problem list. Problem list updated.  Objective  Blood pressure 131/87, pulse (!) 103, weight 202 lb (91.6 kg), last menstrual period 11/05/2021. Pregravid weight 180 lb (81.6 kg) Total Weight Gain 22 lb (9.979 kg) Urinalysis: Urine Protein    Urine Glucose    Fetal Status:     Movement: Present     General:  Alert, oriented and cooperative. Patient is in no acute distress.  Skin: Skin is warm and dry. No rash noted.   Cardiovascular: Normal heart rate noted  Respiratory: Normal respiratory effort, no problems with respiration noted  Abdomen: Soft, gravid, appropriate for gestational age. Pain/Pressure: Absent     Pelvic:  Cervical exam deferred        Extremities: Normal range of motion.  Edema: Trace  Mental Status: Normal mood and affect. Normal behavior. Normal judgment and thought content.   Assessment   22 y.o. G1P0 at [redacted]w[redacted]d by  08/12/2022, by Last Menstrual Period presenting for routine prenatal visit  Plan   first Problems (from  12/23/21 to present)     Problem Noted Resolved   Supervision of other normal pregnancy, antepartum 12/23/2021 by Loran Senters, CMA No   Overview Addendum 05/24/2022  9:23 AM by Mirna Mires, CNM     Clinical Staff Provider  Office Location  [facility] Dating  Not found.  Language  English Anatomy US  Normal female  Flu Vaccine  offer Genetic Screen  NIPS: negative, xx  TDaP vaccine   offer Hgb A1C or  GTT Early : Third trimester :   Covid declined   LAB RESULTS   Rhogam  O/Positive/-- (12/11 1423)  Blood Type O/Positive/-- (12/11 1423)   Feeding Plan breast Antibody Negative (12/11 1423)  Contraception undecided Rubella 3.95 (12/11 1423)  Circumcision yes RPR Non Reactive (12/11 1423)   Pediatrician  undecided HBsAg Negative (12/11 1423)   Support Person Eliberto Ivory HIV Non Reactive (12/11 1423)  Prenatal Classes no Varicella     GBS  (For PCN allergy, check sensitivities)   BTL Consent  Hep C Non Reactive (12/11 1423)   VBAC Consent  Pap Diagnosis  Date Value Ref Range Status  06/03/2021   Final   - Negative for intraepithelial lesion or malignancy (NILM)      Hgb Electro      CF      SMA                   Preterm labor symptoms and general obstetric precautions including but not limited to vaginal bleeding, contractions, leaking of fluid and fetal movement were reviewed in detail with the patient. Please refer to After Visit Summary for other counseling recommendations.   Return in about 2 weeks (around 07/10/2022) for return OB.  Claris Che  Liana Crocker, CNM  06/26/2022 1:52 PM

## 2022-06-29 ENCOUNTER — Ambulatory Visit: Payer: BC Managed Care – PPO | Admitting: Physician Assistant

## 2022-07-10 ENCOUNTER — Ambulatory Visit (INDEPENDENT_AMBULATORY_CARE_PROVIDER_SITE_OTHER): Payer: BC Managed Care – PPO | Admitting: Obstetrics

## 2022-07-10 VITALS — BP 112/74 | HR 112 | Wt 201.0 lb

## 2022-07-10 DIAGNOSIS — Z3483 Encounter for supervision of other normal pregnancy, third trimester: Secondary | ICD-10-CM

## 2022-07-10 DIAGNOSIS — Z113 Encounter for screening for infections with a predominantly sexual mode of transmission: Secondary | ICD-10-CM

## 2022-07-10 DIAGNOSIS — Z3A35 35 weeks gestation of pregnancy: Secondary | ICD-10-CM

## 2022-07-10 DIAGNOSIS — Z3480 Encounter for supervision of other normal pregnancy, unspecified trimester: Secondary | ICD-10-CM

## 2022-07-10 LAB — POCT URINALYSIS DIPSTICK OB
Bilirubin, UA: NEGATIVE
Blood, UA: NEGATIVE
Glucose, UA: NEGATIVE
Ketones, UA: NEGATIVE
Leukocytes, UA: NEGATIVE
Nitrite, UA: NEGATIVE
Spec Grav, UA: <=1.005 — AB (ref 1.010–1.025)
Urobilinogen, UA: 0.2 E.U./dL
pH, UA: 7.5 (ref 5.0–8.0)

## 2022-07-10 NOTE — Progress Notes (Signed)
Routine Prenatal Care Visit  Subjective  Pamela Salas is a 22 y.o. G1P0 at [redacted]w[redacted]d being seen today for ongoing prenatal care.  She is currently monitored for the following issues for this low-risk pregnancy and has Hydrosalpinx; Ovarian retention cyst; GAD (generalized anxiety disorder); Sexual dysfunction; and Supervision of other normal pregnancy, antepartum on their problem list.  ----------------------------------------------------------------------------------- Patient reports no complaints.  Her baby is moving well. She has not done much self education to prepare for labor. Contractions: Not present. Vag. Bleeding: None.  Movement: Present. Leaking Fluid denies.  ----------------------------------------------------------------------------------- The following portions of the patient's history were reviewed and updated as appropriate: allergies, current medications, past family history, past medical history, past social history, past surgical history and problem list. Problem list updated.  Objective  Blood pressure 112/74, pulse (!) 112, weight 201 lb (91.2 kg), last menstrual period 11/05/2021. Pregravid weight 180 lb (81.6 kg) Total Weight Gain 21 lb (9.526 kg) Urinalysis: Urine Protein Trace  Urine Glucose Negative  Fetal Status:     Movement: Present     General:  Alert, oriented and cooperative. Patient is in no acute distress.  Skin: Skin is warm and dry. No rash noted.   Cardiovascular: Normal heart rate noted  Respiratory: Normal respiratory effort, no problems with respiration noted  Abdomen: Soft, gravid, appropriate for gestational age. Pain/Pressure: Absent     Pelvic:  Cervical exam deferred        Extremities: Normal range of motion.     Mental Status: Normal mood and affect. Normal behavior. Normal judgment and thought content.   Assessment   22 y.o. G1P0 at [redacted]w[redacted]d by  08/12/2022, by Last Menstrual Period presenting for routine prenatal visit  Plan   first Problems  (from 12/23/21 to present)     Problem Noted Resolved   Supervision of other normal pregnancy, antepartum 12/23/2021 by Loran Senters, CMA No   Overview Addendum 07/10/2022 10:55 AM by Mirna Mires, CNM     Clinical Staff Provider  Office Location  [facility] Dating  Not found.  Language  English Anatomy US  Normal female  Flu Vaccine  offer Genetic Screen  NIPS: negative, xx  TDaP vaccine  05/24/22 Hgb A1C or  GTT Early : Third trimester : 137  Covid declined   LAB RESULTS   Rhogam  O/Positive/-- (12/11 1423)  Blood Type O/Positive/-- (12/11 1423)   Feeding Plan breast Antibody Negative (12/11 1423)  Contraception undecided Rubella 3.95 (12/11 1423)  Circumcision yes RPR Non Reactive (12/11 1423)   Pediatrician  undecided HBsAg Negative (12/11 1423)   Support Person Eliberto Ivory HIV Non Reactive (12/11 1423)  Prenatal Classes no Varicella     GBS  (For PCN allergy, check sensitivities)   BTL Consent  Hep C Non Reactive (12/11 1423)   VBAC Consent  Pap Diagnosis  Date Value Ref Range Status  06/03/2021   Final   - Negative for intraepithelial lesion or malignancy (NILM)      Hgb Electro      CF      SMA                   Preterm labor symptoms and general obstetric precautions including but not limited to vaginal bleeding, contractions, leaking of fluid and fetal movement were reviewed in detail with the patient. Please refer to After Visit Summary for other counseling recommendations.  We talked about using Youtube videos to see how others handle labor. Encouraged her to walk daily  and use her labor ball at home, and learn more about latent and active labor. Reviewed most appropriate time to go to the hospital for a cervical exam.  Return in about 1 week (around 07/17/2022) for return OB, GBS etc.  Mirna Mires, CNM  07/10/2022 10:57 AM

## 2022-07-17 ENCOUNTER — Ambulatory Visit (INDEPENDENT_AMBULATORY_CARE_PROVIDER_SITE_OTHER): Payer: BC Managed Care – PPO | Admitting: Obstetrics

## 2022-07-17 ENCOUNTER — Encounter: Payer: Self-pay | Admitting: Obstetrics and Gynecology

## 2022-07-17 ENCOUNTER — Observation Stay
Admission: EM | Admit: 2022-07-17 | Discharge: 2022-07-17 | Disposition: A | Discharge status: Home / Self Care | Payer: BC Managed Care – PPO | Attending: Family | Admitting: Family

## 2022-07-17 ENCOUNTER — Encounter: Payer: Self-pay | Admitting: Obstetrics

## 2022-07-17 ENCOUNTER — Other Ambulatory Visit (HOSPITAL_COMMUNITY)
Admission: RE | Admit: 2022-07-17 | Discharge: 2022-07-17 | Disposition: A | Discharge status: Home / Self Care | Payer: BC Managed Care – PPO | Source: Ambulatory Visit | Attending: Obstetrics | Admitting: Obstetrics

## 2022-07-17 ENCOUNTER — Other Ambulatory Visit: Payer: Self-pay

## 2022-07-17 VITALS — BP 129/93 | HR 111 | Wt 203.4 lb

## 2022-07-17 DIAGNOSIS — Z3403 Encounter for supervision of normal first pregnancy, third trimester: Secondary | ICD-10-CM

## 2022-07-17 DIAGNOSIS — Z79899 Other long term (current) drug therapy: Secondary | ICD-10-CM | POA: Diagnosis not present

## 2022-07-17 DIAGNOSIS — O368330 Maternal care for abnormalities of the fetal heart rate or rhythm, third trimester, not applicable or unspecified: Secondary | ICD-10-CM | POA: Insufficient documentation

## 2022-07-17 DIAGNOSIS — Z113 Encounter for screening for infections with a predominantly sexual mode of transmission: Secondary | ICD-10-CM | POA: Diagnosis not present

## 2022-07-17 DIAGNOSIS — Z3480 Encounter for supervision of other normal pregnancy, unspecified trimester: Secondary | ICD-10-CM

## 2022-07-17 DIAGNOSIS — Z3A36 36 weeks gestation of pregnancy: Secondary | ICD-10-CM

## 2022-07-17 DIAGNOSIS — O163 Unspecified maternal hypertension, third trimester: Secondary | ICD-10-CM | POA: Diagnosis not present

## 2022-07-17 DIAGNOSIS — Z7982 Long term (current) use of aspirin: Secondary | ICD-10-CM | POA: Diagnosis not present

## 2022-07-17 LAB — CBC WITH DIFFERENTIAL/PLATELET
Abs Immature Granulocytes: 0.09 K/uL — ABNORMAL HIGH (ref 0.00–0.07)
Basophils Absolute: 0 K/uL (ref 0.0–0.1)
Basophils Relative: 0 %
Eosinophils Absolute: 0 K/uL (ref 0.0–0.5)
Eosinophils Relative: 0 %
HCT: 36.2 % (ref 36.0–46.0)
Hemoglobin: 11.4 g/dL — ABNORMAL LOW (ref 12.0–15.0)
Immature Granulocytes: 1 %
Lymphocytes Relative: 11 %
Lymphs Abs: 1.2 K/uL (ref 0.7–4.0)
MCH: 25.1 pg — ABNORMAL LOW (ref 26.0–34.0)
MCHC: 31.5 g/dL (ref 30.0–36.0)
MCV: 79.6 fL — ABNORMAL LOW (ref 80.0–100.0)
Monocytes Absolute: 1 K/uL (ref 0.1–1.0)
Monocytes Relative: 9 %
Neutro Abs: 8.8 K/uL — ABNORMAL HIGH (ref 1.7–7.7)
Neutrophils Relative %: 79 %
Platelets: 202 K/uL (ref 150–400)
RBC: 4.55 MIL/uL (ref 3.87–5.11)
RDW: 15.3 % (ref 11.5–15.5)
WBC: 11.1 K/uL — ABNORMAL HIGH (ref 4.0–10.5)
nRBC: 0 % (ref 0.0–0.2)

## 2022-07-17 LAB — POCT URINALYSIS DIPSTICK OB
Bilirubin, UA: NEGATIVE
Blood, UA: NEGATIVE
Glucose, UA: NEGATIVE
Ketones, UA: NEGATIVE
Leukocytes, UA: NEGATIVE
Nitrite, UA: NEGATIVE
POC,PROTEIN,UA: NEGATIVE
Spec Grav, UA: 1.01 (ref 1.010–1.025)
Urobilinogen, UA: 0.2 E.U./dL
pH, UA: 6.5 (ref 5.0–8.0)

## 2022-07-17 LAB — COMPREHENSIVE METABOLIC PANEL WITH GFR
ALT: 14 U/L (ref 0–44)
AST: 21 U/L (ref 15–41)
Albumin: 3.1 g/dL — ABNORMAL LOW (ref 3.5–5.0)
Alkaline Phosphatase: 117 U/L (ref 38–126)
Anion gap: 10 (ref 5–15)
BUN: 6 mg/dL (ref 6–20)
CO2: 19 mmol/L — ABNORMAL LOW (ref 22–32)
Calcium: 8.6 mg/dL — ABNORMAL LOW (ref 8.9–10.3)
Chloride: 107 mmol/L (ref 98–111)
Creatinine, Ser: 0.63 mg/dL (ref 0.44–1.00)
GFR, Estimated: >60 mL/min
Glucose, Bld: 101 mg/dL — ABNORMAL HIGH (ref 70–99)
Potassium: 4 mmol/L (ref 3.5–5.1)
Sodium: 136 mmol/L (ref 135–145)
Total Bilirubin: 0.5 mg/dL (ref 0.3–1.2)
Total Protein: 6.3 g/dL — ABNORMAL LOW (ref 6.5–8.1)

## 2022-07-17 LAB — PROTEIN / CREATININE RATIO, URINE
Creatinine, Urine: 37 mg/dL
Total Protein, Urine: <6 mg/dL

## 2022-07-17 NOTE — OB Triage Note (Signed)
Pamela Salas 22 y.o. G1P0GA  presents to Labor & Delivery triage via wheelchair steered by ED staff reporting Elevated blood pressure and swollen feet in the office. . She denies signs and symptoms consistent with rupture of membranes or active vaginal bleeding. She denies contractions and states positive fetal movement. External FM and TOCO applied to non-tender abdomen. Initial FHR 165. Vital signs obtained and within normal limits. Patient oriented to care environment including call bell and bed control use. , CNM notified of patient's arrival. Plan to send labs and continue to evaluate.

## 2022-07-17 NOTE — Final Progress Note (Signed)
OB/Triage Note  Patient ID: Pamela Salas MRN: [id number] DOB/AGE: 2000/11/29 21 y.o.  Subjective  History of Present Illness: The patient is a 22 y.o. female G1P0 at [redacted]w[redacted]d who was sent to [facility] triage from clinic for further blood pressure evaluation. Her initial BP was 128/89, a repeat BP was done and was 129/93, her urine analysis also showed proteinuria and she appeared swollen so was sent to triage for further evaluation.  [name] denies painful contractions, LOF, VB, HA, visual changes, RUQ pain. Endorses fetal movement.   Past Medical History:  Diagnosis Date   Anxiety    Breast mass    Ovarian cyst    age 53    Past Surgical History:  Procedure Laterality Date   NO PAST SURGERIES      No current facility-administered medications on file prior to encounter.   Current Outpatient Medications on File Prior to Encounter  Medication Sig Dispense Refill   aspirin EC 81 MG tablet Take 1 tablet (81 mg total) by mouth daily. Swallow whole. Start at 14 weeks daily 30 tablet 12   buPROPion (WELLBUTRIN SR) 150 MG 12 hr tablet TAKE 1 TABLET BY MOUTH EVERY DAY (Patient not taking: Reported on 07/17/2022) 90 tablet 1   Prenatal Vit-Fe Fumarate-FA (MULTIVITAMIN-PRENATAL) 27-0.8 MG TABS tablet Take 1 tablet by mouth daily at 12 noon.      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  Tobacco Use   Smoking status: Never   Smokeless tobacco: Never  Vaping Use   Vaping Use: Never used  Substance and Sexual Activity   Alcohol use: No    Alcohol/week: 0.0 standard drinks of alcohol   Drug use: No   Sexual activity: Not Currently    Partners: Male    Birth control/protection: None  Other Topics Concern   Not on file  Social History Narrative   Not on file   Social  Determinants of Health   Financial Resource Strain: Low Risk  (12/23/2021)   Overall Financial Resource Strain (CARDIA)    Difficulty of Paying Living Expenses: Not hard at all  Food Insecurity: No Food Insecurity (12/23/2021)   Hunger Vital Sign    Worried About Running Out of Food in the Last Year: Never true    Ran Out of Food in the Last Year: Never true  Transportation Needs: No Transportation Needs (12/23/2021)   PRAPARE - Administrator, Civil Service (Medical): No    Lack of Transportation (Non-Medical): No  Physical Activity: Inactive (12/23/2021)   Exercise Vital Sign    Days of Exercise per Week: 0 days    Minutes of Exercise per Session: 0 min  Stress: No Stress Concern Present (12/23/2021)   [employer] of Occupational Health - Occupational Stress Questionnaire    Feeling of Stress : Not at all  Social Connections: Moderately Integrated (12/23/2021)   Social Connection and Isolation Panel [NHANES]    Frequency of Communication with Friends and Family: More than three times a week    Frequency of Social Gatherings with Friends and Family: Twice a week    Attends Religious Services: 1 to 4 times per year    Active Member of [employer] or Organizations: No    Attends [employer]  or Organization Meetings: Never    Marital Status: Married  Catering manager Violence: Not At Risk (12/23/2021)   Humiliation, Afraid, Rape, and Kick questionnaire    Fear of Current or Ex-Partner: No    Emotionally Abused: No    Physically Abused: No    Sexually Abused: No    Family History  Problem Relation Age of Onset   Cancer Mother 1       breast   Fibromyalgia Mother    Cancer Father    Healthy Father    Healthy Sister    Healthy Brother    COPD Maternal Grandmother    Diabetes Maternal Grandmother        pre diabetes   Depression Maternal Grandmother    Thyroid disease Maternal Grandmother    Aneurysm Maternal Grandmother    Colon cancer Maternal Grandfather    Lung  cancer Maternal Grandfather    Cancer Paternal Grandmother    Healthy Paternal Grandfather      ROS    Objective  Physical Exam: BP 139/88   Pulse (!) 116   Temp 99.4 F (37.4 C) (Oral)   Resp 18   LMP 11/05/2021 (Exact Date)   OBGyn Exam  FHT 155, mod variability, pos accels, no decels Toco: no contractions detected  Significant Findings/ Diagnostic Studies:  Preeclampsia labs are neg (PC ratio, CMP, CBC)   Hospital Course: The patient was admitted to [facility] Triage for observation. Had reactive NST. Initial two blood pressure taken only two minutes apart were mildly elevated, the rest were all WNL. Preeclampsia labs were negative. Has not been four hours between initial elevated reading so patient also does not meet criteria currently for gestational hypertension. All discussed with patient. Preeclampsia warning signs discussed, has next ROB next Monday, plans to monitor BP at home once daily until then, told to return for any severe pressures or preeclampsia s/sx.  Assessment: 22 y.o. female G1P0 at [redacted]w[redacted]d  Isolated elevated blood pressure RNST  Plan: Discharge home Follow up next Monday at [facility] Teaching: preeclampsia s/sx, home BP monitoring  Discharge Instructions     Discharge activity:  No Restrictions   Complete by: As directed    Discharge diet:  No restrictions   Complete by: As directed    Discharge instructions   Complete by: As directed    Keep Mondays' ROB appt Preeclampsia s/sx discussed   No sexual activity restrictions   Complete by: As directed    Notify physician for a general feeling that "something is not right"   Complete by: As directed    Notify physician for increase or change in vaginal discharge   Complete by: As directed    Notify physician for intestinal cramps, with or without diarrhea, sometimes described as "gas pain"   Complete by: As directed    Notify physician for leaking of fluid   Complete by: As directed    Notify physician for  low, dull backache, unrelieved by heat or Tylenol   Complete by: As directed    Notify physician for menstrual like cramps   Complete by: As directed    Notify physician for pelvic pressure   Complete by: As directed    Notify physician for uterine contractions.  These may be painless and feel like the uterus is tightening or the baby is  "balling up"   Complete by: As directed    Notify physician for vaginal bleeding   Complete by: As directed    PRETERM LABOR:  Includes  any of the follwing symptoms that occur between 20 - [redacted] weeks gestation.  If these symptoms are not stopped, preterm labor can result in preterm delivery, placing your baby at risk   Complete by: As directed       Allergies as of 07/17/2022   No Known Allergies      Medication List     TAKE these medications    aspirin EC 81 MG tablet Take 1 tablet (81 mg total) by mouth daily. Swallow whole. Start at 14 weeks daily   buPROPion 150 MG 12 hr tablet Commonly known as: WELLBUTRIN SR TAKE 1 TABLET BY MOUTH EVERY DAY   multivitamin-prenatal 27-0.8 MG Tabs tablet Take 1 tablet by mouth daily at 12 noon.         Total time spent taking care of this patient: 55 minutes  Signed: [name] CNM, FNP 07/17/2022, 1:40 PM

## 2022-07-17 NOTE — Progress Notes (Addendum)
Routine Prenatal Care Visit  Subjective  Pamela Salas is a 22 y.o. G1P0 at [redacted]w[redacted]d being seen today for ongoing prenatal care.  She is currently monitored for the following issues for this low-risk pregnancy and has Hydrosalpinx; Ovarian retention cyst; GAD (generalized anxiety disorder); Sexual dysfunction; and Supervision of other normal pregnancy, antepartum on their problem list.  ----------------------------------------------------------------------------------- Patient reports no complaints.  Occasionally some difficulty with sleep as she is full of baby. Contractions: Not present. Vag. Bleeding: None.  Movement: Present. Leaking Fluid denies.  ----------------------------------------------------------------------------------- The following portions of the patient's history were reviewed and updated as appropriate: allergies, current medications, past family history, past medical history, past social history, past surgical history and problem list. Problem list updated.  Objective  Blood pressure 128/89, pulse 89, weight 203 lb 6.4 oz (92.3 kg), last menstrual period 11/05/2021. Pregravid weight 180 lb (81.6 kg) Total Weight Gain 23 lb 6.4 oz (10.6 kg) Urinalysis: Urine Protein    Urine Glucose    Fetal Status:     Movement: Present     General:  Alert, oriented and cooperative. Patient is in no acute distress.  Skin: Skin is warm and dry. No rash noted.   Cardiovascular: Normal heart rate noted  Respiratory: Normal respiratory effort, no problems with respiration noted  Abdomen: Soft, gravid, appropriate for gestational age. Pain/Pressure: Absent     Pelvic:  Cervical exam deferred        Extremities: Normal range of motion.  Edema: None  Mental Status: Normal mood and affect. Normal behavior. Normal judgment and thought content.   Assessment   22 y.o. G1P0 at [redacted]w[redacted]d by  08/12/2022, by Last Menstrual Period presenting for routine prenatal visit  Plan   first Problems (from  12/23/21 to present)     Problem Noted Resolved   Supervision of other normal pregnancy, antepartum 12/23/2021 by Loran Senters, CMA No   Overview Addendum 07/17/2022 10:41 AM by Mirna Mires, CNM     Clinical Staff Provider  Office Location  [facility] Dating  Not found.  Language  English Anatomy US  Normal female  Flu Vaccine  offer Genetic Screen  NIPS: negative, xx  TDaP vaccine  05/24/22 Hgb A1C or  GTT Early : Third trimester : 137  Covid declined   LAB RESULTS   Rhogam  O/Positive/-- (12/11 1423)  Blood Type O/Positive/-- (12/11 1423)   Feeding Plan breast Antibody Negative (12/11 1423)  Contraception undecided Rubella 3.95 (12/11 1423)  Circumcision yes RPR Non Reactive (12/11 1423)   Pediatrician  undecided HBsAg Negative (12/11 1423)   Support Person Eliberto Ivory HIV Non Reactive (12/11 1423)  Prenatal Classes no Varicella immune    GBS  (For PCN allergy, check sensitivities)   BTL Consent  Hep C Non Reactive (12/11 1423)   VBAC Consent  Pap Diagnosis  Date Value Ref Range Status  06/03/2021   Final   - Negative for intraepithelial lesion or malignancy (NILM)      Hgb Electro      CF      SMA                   Preterm labor symptoms and general obstetric precautions including but not limited to vaginal bleeding, contractions, leaking of fluid and fetal movement were reviewed in detail with the patient. Please refer to After Visit Summary for other counseling recommendations.  Blood pressure rechecked today: 129/93. Discussed this, and she is heading to [facility] for serial BP checks  and labs. [name] has been alerted.  Return in about 1 week (around 07/24/2022) for return OB.  Mirna Mires, CNM  07/17/2022 10:41 AM

## 2022-07-18 LAB — CERVICOVAGINAL ANCILLARY ONLY
Chlamydia: NEGATIVE
Comment: NEGATIVE
Comment: NORMAL
Neisseria Gonorrhea: NEGATIVE

## 2022-07-21 LAB — CULTURE, BETA STREP (GROUP B ONLY): Strep Gp B Culture: NEGATIVE

## 2022-07-24 ENCOUNTER — Ambulatory Visit (INDEPENDENT_AMBULATORY_CARE_PROVIDER_SITE_OTHER): Payer: BC Managed Care – PPO | Admitting: Obstetrics

## 2022-07-24 VITALS — BP 133/89 | HR 116 | Wt 208.0 lb

## 2022-07-24 DIAGNOSIS — Z3480 Encounter for supervision of other normal pregnancy, unspecified trimester: Secondary | ICD-10-CM

## 2022-07-24 DIAGNOSIS — Z3A37 37 weeks gestation of pregnancy: Secondary | ICD-10-CM

## 2022-07-24 DIAGNOSIS — Z3403 Encounter for supervision of normal first pregnancy, third trimester: Secondary | ICD-10-CM

## 2022-07-24 DIAGNOSIS — O163 Unspecified maternal hypertension, third trimester: Secondary | ICD-10-CM

## 2022-07-24 LAB — POCT URINALYSIS DIPSTICK OB
Bilirubin, UA: NEGATIVE
Blood, UA: NEGATIVE
Glucose, UA: NEGATIVE
Ketones, UA: POSITIVE
Leukocytes, UA: NEGATIVE
Nitrite, UA: NEGATIVE
POC,PROTEIN,UA: NEGATIVE
Spec Grav, UA: 1.02 (ref 1.010–1.025)
Urobilinogen, UA: 0.2 E.U./dL
pH, UA: 6 (ref 5.0–8.0)

## 2022-07-24 NOTE — Progress Notes (Addendum)
    Return Prenatal Note   Assessment/Plan   Plan  22 y.o. G1P0 at [redacted]w[redacted]d presents for follow-up OB visit. Reviewed prenatal record including previous visit note.  Elevated blood pressure affecting pregnancy in third trimester, antepartum -BP WNL today. Instructed to go to hospital if she develops HA, visual changes, or epigastric pain. -Reviewed preeclampsia and GHTN   Supervision of other normal pregnancy, antepartum -Reviewed s/s of labor and when to go to the hospital -Herbal labor prep handout given -Cephalic presentation confirmed by BSUS    Orders Placed This Encounter  Procedures   POC Urinalysis Dipstick OB   Return in about 1 week (around 07/31/2022).   Future Appointments  Date Time Provider Department Center  07/31/2022 10:15 AM Mirna Mires, CNM AOB-AOB None    For next visit:  continue with routine prenatal care Repeat preeclampsia labs if indicated     Subjective   22 y.o. G1P0 at [redacted]w[redacted]d presents for this follow-up prenatal visit.  Quenna is feeling well and denies LOF, vaginal bleeding, and ctx. She is having some irregular cramping/BH. She is leaking colostrum. She denies HA, visual changes, and epigastric pain. She has ketones in her urine today and reports that she has not been drinking much water.  Patient reports: Movement: Present Contractions: Irregular  Objective   Flow sheet Vitals: Pulse Rate: (!) 116 BP: 133/89 Fundal Height: 38 cm Fetal Heart Rate (bpm): 150 Presentation: Vertex Total weight gain: 28 lb (12.7 kg)  General Appearance  No acute distress, well appearing, and well nourished Pulmonary   Normal work of breathing Neurologic   Alert and oriented to person, place, and time Psychiatric   Mood and affect within normal limits  Glenetta Borg, CNM  07/24/22 10:40 AM

## 2022-07-24 NOTE — Assessment & Plan Note (Signed)
-  BP WNL today. Instructed to go to hospital if she develops HA, visual changes, or epigastric pain. -Reviewed preeclampsia and GHTN

## 2022-07-24 NOTE — Assessment & Plan Note (Signed)
-  Reviewed s/s of labor and when to go to the hospital -Herbal labor prep handout given

## 2022-07-31 ENCOUNTER — Telehealth: Payer: Self-pay | Admitting: Obstetrics

## 2022-07-31 ENCOUNTER — Encounter: Payer: BC Managed Care – PPO | Admitting: Obstetrics

## 2022-07-31 NOTE — Telephone Encounter (Signed)
The patient is aware of scheduling change via My chart.

## 2022-07-31 NOTE — Telephone Encounter (Signed)
I contacted the patient via phone. I left voicemail for the patient, rescheduled due to [facility]F out of office, The patient has been rescheduled to [facility] on 6/26 at 10:35 am. I advised the patient to call back due to reschedule.

## 2022-08-02 ENCOUNTER — Other Ambulatory Visit: Payer: Self-pay

## 2022-08-02 ENCOUNTER — Observation Stay
Admission: EM | Admit: 2022-08-02 | Discharge: 2022-08-02 | Disposition: A | Discharge status: Home / Self Care | Payer: BC Managed Care – PPO | Attending: Certified Nurse Midwife | Admitting: Certified Nurse Midwife

## 2022-08-02 ENCOUNTER — Encounter: Payer: Self-pay | Admitting: Certified Nurse Midwife

## 2022-08-02 ENCOUNTER — Ambulatory Visit (INDEPENDENT_AMBULATORY_CARE_PROVIDER_SITE_OTHER): Payer: BC Managed Care – PPO | Admitting: Obstetrics

## 2022-08-02 VITALS — BP 130/92 | HR 111 | Wt 211.3 lb

## 2022-08-02 DIAGNOSIS — O163 Unspecified maternal hypertension, third trimester: Secondary | ICD-10-CM | POA: Diagnosis not present

## 2022-08-02 DIAGNOSIS — O139 Gestational [pregnancy-induced] hypertension without significant proteinuria, unspecified trimester: Secondary | ICD-10-CM | POA: Insufficient documentation

## 2022-08-02 DIAGNOSIS — O133 Gestational [pregnancy-induced] hypertension without significant proteinuria, third trimester: Secondary | ICD-10-CM

## 2022-08-02 DIAGNOSIS — Z3480 Encounter for supervision of other normal pregnancy, unspecified trimester: Secondary | ICD-10-CM

## 2022-08-02 DIAGNOSIS — O368330 Maternal care for abnormalities of the fetal heart rate or rhythm, third trimester, not applicable or unspecified: Principal | ICD-10-CM | POA: Insufficient documentation

## 2022-08-02 DIAGNOSIS — Z3A38 38 weeks gestation of pregnancy: Secondary | ICD-10-CM | POA: Diagnosis not present

## 2022-08-02 LAB — COMPREHENSIVE METABOLIC PANEL
ALT: 11 U/L (ref 0–44)
AST: 20 U/L (ref 15–41)
Albumin: 2.9 g/dL — ABNORMAL LOW (ref 3.5–5.0)
Alkaline Phosphatase: 129 U/L — ABNORMAL HIGH (ref 38–126)
Anion gap: 9 (ref 5–15)
BUN: 6 mg/dL (ref 6–20)
CO2: 20 mmol/L — ABNORMAL LOW (ref 22–32)
Calcium: 8.8 mg/dL — ABNORMAL LOW (ref 8.9–10.3)
Chloride: 106 mmol/L (ref 98–111)
Creatinine, Ser: 0.55 mg/dL (ref 0.44–1.00)
GFR, Estimated: >60 mL/min (ref >60)
Glucose, Bld: 133 mg/dL — ABNORMAL HIGH (ref 70–99)
Potassium: 4.1 mmol/L (ref 3.5–5.1)
Sodium: 135 mmol/L (ref 135–145)
Total Bilirubin: 0.4 mg/dL (ref 0.3–1.2)
Total Protein: 6.1 g/dL — ABNORMAL LOW (ref 6.5–8.1)

## 2022-08-02 LAB — PROTEIN / CREATININE RATIO, URINE
Creatinine, Urine: 26 mg/dL
Total Protein, Urine: <6 mg/dL

## 2022-08-02 LAB — CBC
HCT: 36.6 % (ref 36.0–46.0)
Hemoglobin: 11.4 g/dL — ABNORMAL LOW (ref 12.0–15.0)
MCH: 24.7 pg — ABNORMAL LOW (ref 26.0–34.0)
MCHC: 31.1 g/dL (ref 30.0–36.0)
MCV: 79.4 fL — ABNORMAL LOW (ref 80.0–100.0)
Platelets: 174 10*3/uL (ref 150–400)
RBC: 4.61 MIL/uL (ref 3.87–5.11)
RDW: 16.1 % — ABNORMAL HIGH (ref 11.5–15.5)
WBC: 10.6 10*3/uL — ABNORMAL HIGH (ref 4.0–10.5)
nRBC: 0 % (ref 0.0–0.2)

## 2022-08-02 LAB — POCT URINALYSIS DIPSTICK OB
Bilirubin, UA: NEGATIVE
Blood, UA: NEGATIVE
Glucose, UA: NEGATIVE
Ketones, UA: NEGATIVE
Leukocytes, UA: NEGATIVE
Nitrite, UA: NEGATIVE
POC,PROTEIN,UA: NEGATIVE
Spec Grav, UA: 1.01 (ref 1.010–1.025)
Urobilinogen, UA: 0.2 E.U./dL
pH, UA: 6.5 (ref 5.0–8.0)

## 2022-08-02 NOTE — Assessment & Plan Note (Signed)
-  Reviewed s/s of labor and when to go to the hospital -Discussed way to encourage labor

## 2022-08-02 NOTE — OB Triage Note (Signed)
Patient is a G1P0 at [redacted]w[redacted]d who was sent over from the office for a PIH eval. Reports +FM, denies LOF, ctx, and vaginal bleeding. Denies HA, blurry vision, and epigastric pain. External monitors applied and assessing. Initial FHT 180.

## 2022-08-02 NOTE — Progress Notes (Signed)
    Return Prenatal Note   Assessment/Plan   Plan  22 y.o. G1P0 at [redacted]w[redacted]d presents for follow-up OB visit. Reviewed prenatal record including previous visit note.  Gestational hypertension -Elevated BP in clinic today x 2. H/o elevated BP on 07/17/22. -To L&D for preeclampsia workup -Discussed timing of possible IOL depending on BPs at the hospital and lab results -Reviewed methods of induction  Supervision of other normal pregnancy, antepartum -Reviewed s/s of labor and when to go to the hospital -Discussed way to encourage labor     Orders Placed This Encounter  Procedures   POC Urinalysis Dipstick OB   Return in about 1 week (around 08/09/2022).   Future Appointments  Date Time Provider Department Center  08/08/2022 10:15 AM AOB-NST ROOM AOB-AOB None  08/08/2022 10:35 AM Dominic, Courtney Heys, CNM AOB-AOB None    For next visit:   ROB/NST     Subjective   21 y.o. G1P0 at [redacted]w[redacted]d presents for this follow-up prenatal visit.  Jorene has been feeling well. She denies HA, visual changes, epigastric pain, regular ctx, LOF, and bloody show. She does have some swelling in her hands and feet. Patient reports: Movement: Present Contractions: Irritability  Objective   Flow sheet Vitals: Pulse Rate: (!) 111 BP: (!) 130/92 Fundal Height: 40 cm Fetal Heart Rate (bpm): 161 Dilation: Fingertip Effacement (%): Thick Station: Ballotable Total weight gain: 31 lb (14.1 kg)  General Appearance  No acute distress, well appearing, and well nourished Pulmonary   Normal work of breathing Neurologic   Alert and oriented to person, place, and time Psychiatric   Mood and affect within normal limits  Glenetta Borg, CNM  06/26/241:56 PM

## 2022-08-02 NOTE — OB Triage Note (Signed)
Discharge instructions, labor precautions, and follow-up care reviewed with patient and significant other. All questions answered. Patient verbalized understanding. Discharged ambulatory off unit.  

## 2022-08-02 NOTE — Assessment & Plan Note (Signed)
-  Elevated BP in clinic today x 2. H/o elevated BP on 07/17/22. -To L&D for preeclampsia workup -Discussed timing of possible IOL depending on BPs at the hospital and lab results -Reviewed methods of induction

## 2022-08-02 NOTE — OB Triage Note (Signed)
L&D OB Triage Note  SUBJECTIVE Pamela Salas is a 22 y.o. G1P0 female at [redacted]w[redacted]d, EDD Estimated Date of Delivery: 08/12/22 who presented to triage from the office for blood pressure evaluation. She had 2 elevated Bps in the office.   OB History  Gravida Para Term Preterm AB Living  1 0 0 0 0 0  SAB IAB Ectopic Multiple Live Births  0 0 0 0 0    # Outcome Date GA Lbr Len/2nd Weight Sex Delivery Anes PTL Lv  1 Current             Medications Prior to Admission  Medication Sig Dispense Refill Last Dose   aspirin EC 81 MG tablet Take 1 tablet (81 mg total) by mouth daily. Swallow whole. Start at 14 weeks daily 30 tablet 12    buPROPion (WELLBUTRIN SR) 150 MG 12 hr tablet TAKE 1 TABLET BY MOUTH EVERY DAY (Patient not taking: Reported on 07/24/2022) 90 tablet 1    Prenatal Vit-Fe Fumarate-FA (MULTIVITAMIN-PRENATAL) 27-0.8 MG TABS tablet Take 1 tablet by mouth daily at 12 noon.        OBJECTIVE  Nursing Evaluation:   BP 132/81   Pulse (!) 115   Temp 98.3 F (36.8 C) (Oral)   Resp 17   Ht 5\' 1"  (1.549 m)   Wt 95.7 kg   LMP 11/05/2021 (Exact Date)   BMI 39.87 kg/m    Findings:   physical exam negative for pre eclampsia Labs negative for Pre eclampsia      NST was performed and has been reviewed by me.  NST INTERPRETATION: Category I  Mode: External Baseline Rate (A): 160 bpm Variability: Moderate Accelerations: 15 x 15 Decelerations: None     Contraction Frequency (min): occ w/ ui      Latest Ref Rng & Units 08/02/2022   12:36 PM 07/17/2022   12:24 PM 06/03/2021    3:19 PM  CMP  Glucose 70 - 99 mg/dL 161  096  73   BUN 6 - 20 mg/dL 6  6  11    Creatinine 0.44 - 1.00 mg/dL 0.45  4.09  8.11   Sodium 135 - 145 mmol/L 135  136  140   Potassium 3.5 - 5.1 mmol/L 4.1  4.0  4.4   Chloride 98 - 111 mmol/L 106  107  102   CO2 22 - 32 mmol/L 20  19  17    Calcium 8.9 - 10.3 mg/dL 8.8  8.6  9.3   Total Protein 6.5 - 8.1 g/dL 6.1  6.3  6.8   Total Bilirubin 0.3 - 1.2 mg/dL  0.4  0.5  <9.1   Alkaline Phos 38 - 126 U/L 129  117  96   AST 15 - 41 U/L 20  21  21    ALT 0 - 44 U/L 11  14  19     CBC    Component Value Date/Time   WBC 10.6 (H) 08/02/2022 1236   RBC 4.61 08/02/2022 1236   HGB 11.4 (L) 08/02/2022 1236   HGB 11.1 05/24/2022 1007   HCT 36.6 08/02/2022 1236   HCT 35.3 05/24/2022 1007   PLT 174 08/02/2022 1236   PLT 234 05/24/2022 1007   MCV 79.4 (L) 08/02/2022 1236   MCV 84 05/24/2022 1007   MCV 86 05/17/2011 1857   MCH 24.7 (L) 08/02/2022 1236   MCHC 31.1 08/02/2022 1236   RDW 16.1 (H) 08/02/2022 1236   RDW 13.6 05/24/2022 1007   RDW  14.0 05/17/2011 1857   LYMPHSABS 1.2 07/17/2022 1224   LYMPHSABS 1.0 05/24/2022 1007   MONOABS 1.0 07/17/2022 1224   EOSABS 0.0 07/17/2022 1224   EOSABS 0.0 05/24/2022 1007   BASOSABS 0.0 07/17/2022 1224   BASOSABS 0.0 05/24/2022 1007    Protein / creatinine ratio, urine Order: [id number] Status: Final result     Visible to patient: No (scheduled for 08/02/2022  1:59 PM)     Next appt: 08/08/2022 at 10:15 AM in Obstetrics and Gynecology (AOB-NST ROOM)   0 Result Notes     Component Ref Range & Units 12:22 2 wk ago  Creatinine, Urine mg/dL 26 37  Total Protein, Urine mg/dL <6 <6 CM  Comment: NO NORMAL RANGE ESTABLISHED FOR THIS TEST  Protein Creatinine Ratio 0.00 - 0.15 mg/mg{               CM  Comment: RESULT BELOW REPORTABLE RANGE, UNABLE TO CALCULATE. Performed at [facility], [address] [place], [place] [address]  Resulting Agency [facility] CLIN LAB [facility] LAB         Specimen Collected: 08/02/22 12:22 Last Resulted: 08/02/22 12:59            ASSESSMENT Impression:  1.  Pregnancy:  G1P0 at [redacted]w[redacted]d , EDD Estimated Date of Delivery: 08/12/22 2.  Reassuring fetal and maternal status 3.  Negative for pre eclampsia  4. Dr. Logan Bores consulted and in agreement to plan.   PLAN 1. Current condition and above findings reviewed.  Reassuring fetal and maternal condition. Signs and  symptoms of pre eclampsia reviewed with pt and her partner.  2. Discharge home with standard labor precautions given to return to L&D or call the office for problems. 3. Continue routine prenatal care. Follow up Monday/Tuesday in the office.   I evaluated this pt in person.   Doreene Burke, CNM

## 2022-08-07 DIAGNOSIS — Z3A39 39 weeks gestation of pregnancy: Secondary | ICD-10-CM | POA: Insufficient documentation

## 2022-08-07 NOTE — Progress Notes (Signed)
    NURSE VISIT NOTE  Subjective:    Patient ID: Pamela Salas, female    DOB: [birth year], 22 y.o.   MRN: [id number]  HPI  Patient is a 22 y.o. G1P0 female who presents for fetal monitoring per order from [name], CNM.   Objective:    LMP 11/05/2021 (Exact Date)  Estimated Date of Delivery: 08/12/22  [redacted]w[redacted]d  Fetus A Non-Stress Test Interpretation for 08/07/22  Indication: Gestational Hypertension            Assessment:   1. Gestational hypertension, third trimester   2. [redacted] weeks gestation of pregnancy      Plan:   Results reviewed and discussed with patient by  Carie Caddy, CNM.     Rocco Serene, LPN

## 2022-08-07 NOTE — Patient Instructions (Signed)

## 2022-08-08 ENCOUNTER — Ambulatory Visit (INDEPENDENT_AMBULATORY_CARE_PROVIDER_SITE_OTHER): Payer: BC Managed Care – PPO

## 2022-08-08 ENCOUNTER — Ambulatory Visit (INDEPENDENT_AMBULATORY_CARE_PROVIDER_SITE_OTHER): Payer: BC Managed Care – PPO | Admitting: Licensed Practical Nurse

## 2022-08-08 VITALS — BP 141/92 | HR 112 | Ht 61.0 in | Wt 216.0 lb

## 2022-08-08 VITALS — BP 141/92 | HR 112 | Wt 216.0 lb

## 2022-08-08 DIAGNOSIS — Z3A39 39 weeks gestation of pregnancy: Secondary | ICD-10-CM

## 2022-08-08 DIAGNOSIS — Z3480 Encounter for supervision of other normal pregnancy, unspecified trimester: Secondary | ICD-10-CM

## 2022-08-08 DIAGNOSIS — O133 Gestational [pregnancy-induced] hypertension without significant proteinuria, third trimester: Secondary | ICD-10-CM | POA: Diagnosis not present

## 2022-08-08 DIAGNOSIS — Z3490 Encounter for supervision of normal pregnancy, unspecified, unspecified trimester: Secondary | ICD-10-CM

## 2022-08-08 DIAGNOSIS — Z3483 Encounter for supervision of other normal pregnancy, third trimester: Secondary | ICD-10-CM

## 2022-08-08 LAB — POCT URINALYSIS DIPSTICK
Bilirubin, UA: NEGATIVE
Blood, UA: NEGATIVE
Glucose, UA: NEGATIVE
Ketones, UA: NEGATIVE
Leukocytes, UA: NEGATIVE
Nitrite, UA: NEGATIVE
Protein, UA: NEGATIVE
Spec Grav, UA: 1.01 (ref 1.010–1.025)
Urobilinogen, UA: 0.2 E.U./dL
pH, UA: 6.5 (ref 5.0–8.0)

## 2022-08-08 NOTE — Progress Notes (Signed)
Routine Prenatal Care Visit  Subjective  Pamela Salas is a 22 y.o. G1P0 at [redacted]w[redacted]d being seen today for ongoing prenatal care.  She is currently monitored for the following issues for this high-risk pregnancy and has Hydrosalpinx; Ovarian retention cyst; GAD (generalized anxiety disorder); Sexual dysfunction; Supervision of other normal pregnancy, antepartum; Elevated blood pressure affecting pregnancy in third trimester, antepartum; Labor and delivery, indication for care; Gestational hypertension; and [redacted] weeks gestation of pregnancy on their problem list.  ----------------------------------------------------------------------------------- Patient reports  swelling. Feels "over it", the swelling is uncomfortable .  Here with partner.  -Rec IOL this week for [facility], pt would prefer spontaneous labor, but agreeable to IOL.  -reviewed method of induction is based on cervical exam, she can expect to start to with a ripening balloon and cytotec. Pt may consider an epidural. Her partner and mother will be her support people. -RSNt today   Contractions: Not present. Vag. Bleeding: None.  Movement: Present. Leaking Fluid denies.  ----------------------------------------------------------------------------------- The following portions of the patient's history were reviewed and updated as appropriate: allergies, current medications, past family history, past medical history, past social history, past surgical history and problem list. Problem list updated.  Objective  Blood pressure (!) 141/92, pulse (!) 112, weight 216 lb (98 kg), last menstrual period 11/05/2021. Pregravid weight 180 lb (81.6 kg) Total Weight Gain 36 lb (16.3 kg) Urinalysis: Urine Protein    Urine Glucose    Fetal Status: Fetal Heart Rate (bpm): 155   Movement: Present  Presentation: Vertex  General:  Alert, oriented and cooperative. Patient is in no acute distress.  Skin: Skin is warm and dry. No rash noted.   Cardiovascular: Normal  heart rate noted  Respiratory: Normal respiratory effort, no problems with respiration noted  Abdomen: Soft, gravid, appropriate for gestational age. Pain/Pressure: Absent     Pelvic:  Cervical exam deferred Dilation: Fingertip Effacement (%): 50 Station: -3  Extremities: Normal range of motion.  Edema: Mild pitting, slight indentation  Mental Status: Normal mood and affect. Normal behavior. Normal judgment and thought content.   Assessment   22 y.o. G1P0 at [redacted]w[redacted]d by  08/12/2022, by Last Menstrual Period presenting for routine prenatal visit  Plan   first Problems (from 12/23/21 to present)     Problem Noted Resolved   Supervision of other normal pregnancy, antepartum 12/23/2021 by Loran Senters, CMA No   Overview Addendum 07/21/2022  5:03 PM by Mirna Mires, CNM     Clinical Staff Provider  Office Location  [facility] Dating  Not found.  Language  English Anatomy US  Normal female  Flu Vaccine  offer Genetic Screen  NIPS: negative, xx  TDaP vaccine  05/24/22 Hgb A1C or  GTT Early : Third trimester : 137  Covid declined   LAB RESULTS   Rhogam  O/Positive/-- (12/11 1423)  Blood Type O/Positive/-- (12/11 1423)   Feeding Plan breast Antibody Negative (12/11 1423)  Contraception undecided Rubella 3.95 (12/11 1423)  Circumcision yes RPR Non Reactive (12/11 1423)   Pediatrician  undecided HBsAg Negative (12/11 1423)   Support Person Eliberto Ivory HIV Non Reactive (12/11 1423)  Prenatal Classes no Varicella immune    GBS  (For PCN allergy, check sensitivities) negative  BTL Consent  Hep C Non Reactive (12/11 1423)   VBAC Consent  Pap Diagnosis  Date Value Ref Range Status  06/03/2021   Final   - Negative for intraepithelial lesion or malignancy (NILM)      Hgb Electro  CF      SMA                   Term labor symptoms and general obstetric precautions including but not limited to vaginal bleeding, contractions, leaking of fluid and fetal movement were reviewed in  detail with the patient. Please refer to After Visit Summary for other counseling recommendations.   IOL July 3 at 0800, orders placed   Carie Caddy, [place]   [facility]  08/08/22  11:21 AM

## 2022-08-09 ENCOUNTER — Other Ambulatory Visit: Payer: Self-pay

## 2022-08-09 ENCOUNTER — Encounter: Payer: Self-pay | Admitting: Obstetrics and Gynecology

## 2022-08-09 ENCOUNTER — Inpatient Hospital Stay
Admission: RE | Admit: 2022-08-09 | Discharge: 2022-08-12 | DRG: 787 | Disposition: A | Discharge status: Home / Self Care | Payer: BC Managed Care – PPO | Source: Ambulatory Visit | Attending: Obstetrics | Admitting: Obstetrics

## 2022-08-09 DIAGNOSIS — O3663X0 Maternal care for excessive fetal growth, third trimester, not applicable or unspecified: Secondary | ICD-10-CM | POA: Diagnosis present

## 2022-08-09 DIAGNOSIS — O134 Gestational [pregnancy-induced] hypertension without significant proteinuria, complicating childbirth: Secondary | ICD-10-CM | POA: Diagnosis not present

## 2022-08-09 DIAGNOSIS — Z3A39 39 weeks gestation of pregnancy: Secondary | ICD-10-CM

## 2022-08-09 DIAGNOSIS — D62 Acute posthemorrhagic anemia: Secondary | ICD-10-CM | POA: Diagnosis not present

## 2022-08-09 DIAGNOSIS — O9903 Anemia complicating the puerperium: Secondary | ICD-10-CM | POA: Diagnosis not present

## 2022-08-09 DIAGNOSIS — O139 Gestational [pregnancy-induced] hypertension without significant proteinuria, unspecified trimester: Secondary | ICD-10-CM | POA: Diagnosis present

## 2022-08-09 DIAGNOSIS — Z3480 Encounter for supervision of other normal pregnancy, unspecified trimester: Principal | ICD-10-CM

## 2022-08-09 DIAGNOSIS — O9081 Anemia of the puerperium: Secondary | ICD-10-CM | POA: Diagnosis not present

## 2022-08-09 DIAGNOSIS — Z3490 Encounter for supervision of normal pregnancy, unspecified, unspecified trimester: Secondary | ICD-10-CM | POA: Diagnosis present

## 2022-08-09 DIAGNOSIS — O99345 Other mental disorders complicating the puerperium: Secondary | ICD-10-CM | POA: Diagnosis not present

## 2022-08-09 DIAGNOSIS — O99214 Obesity complicating childbirth: Secondary | ICD-10-CM | POA: Diagnosis present

## 2022-08-09 DIAGNOSIS — F411 Generalized anxiety disorder: Secondary | ICD-10-CM | POA: Diagnosis not present

## 2022-08-09 DIAGNOSIS — O99344 Other mental disorders complicating childbirth: Secondary | ICD-10-CM | POA: Diagnosis not present

## 2022-08-09 DIAGNOSIS — Z7982 Long term (current) use of aspirin: Secondary | ICD-10-CM

## 2022-08-09 DIAGNOSIS — O3660X0 Maternal care for excessive fetal growth, unspecified trimester, not applicable or unspecified: Secondary | ICD-10-CM | POA: Diagnosis present

## 2022-08-09 LAB — COMPREHENSIVE METABOLIC PANEL
ALT: 15 U/L (ref 0–44)
AST: 30 U/L (ref 15–41)
Albumin: 3 g/dL — ABNORMAL LOW (ref 3.5–5.0)
Alkaline Phosphatase: 136 U/L — ABNORMAL HIGH (ref 38–126)
Anion gap: 12 (ref 5–15)
BUN: 6 mg/dL (ref 6–20)
CO2: 18 mmol/L — ABNORMAL LOW (ref 22–32)
Calcium: 8.5 mg/dL — ABNORMAL LOW (ref 8.9–10.3)
Chloride: 106 mmol/L (ref 98–111)
Creatinine, Ser: 0.5 mg/dL (ref 0.44–1.00)
GFR, Estimated: >60 mL/min (ref >60)
Glucose, Bld: 152 mg/dL — ABNORMAL HIGH (ref 70–99)
Potassium: 3.4 mmol/L — ABNORMAL LOW (ref 3.5–5.1)
Sodium: 136 mmol/L (ref 135–145)
Total Bilirubin: 0.6 mg/dL (ref 0.3–1.2)
Total Protein: 5.9 g/dL — ABNORMAL LOW (ref 6.5–8.1)

## 2022-08-09 LAB — TYPE AND SCREEN
ABO/RH(D): O POS
Antibody Screen: NEGATIVE

## 2022-08-09 LAB — ABO/RH: ABO/RH(D): O POS

## 2022-08-09 LAB — CBC
HCT: 35.6 % — ABNORMAL LOW (ref 36.0–46.0)
Hemoglobin: 11.4 g/dL — ABNORMAL LOW (ref 12.0–15.0)
MCH: 25.2 pg — ABNORMAL LOW (ref 26.0–34.0)
MCHC: 32 g/dL (ref 30.0–36.0)
MCV: 78.8 fL — ABNORMAL LOW (ref 80.0–100.0)
Platelets: 190 10*3/uL (ref 150–400)
RBC: 4.52 MIL/uL (ref 3.87–5.11)
RDW: 17 % — ABNORMAL HIGH (ref 11.5–15.5)
WBC: 9.9 10*3/uL (ref 4.0–10.5)
nRBC: 0 % (ref 0.0–0.2)

## 2022-08-09 LAB — PROTEIN / CREATININE RATIO, URINE
Creatinine, Urine: 94 mg/dL
Protein Creatinine Ratio: 0.12 mg/mg{Cre} (ref 0.00–0.15)
Total Protein, Urine: 11 mg/dL

## 2022-08-09 MED ORDER — OXYTOCIN-SODIUM CHLORIDE 30-0.9 UT/500ML-% IV SOLN
2.5000 [IU]/h | INTRAVENOUS | Status: DC
  Filled 2022-08-09: qty 500

## 2022-08-09 MED ORDER — MISOPROSTOL 25 MCG QUARTER TABLET
25.0000 ug | ORAL_TABLET | Freq: Once | ORAL | Status: AC
  Administered 2022-08-09: 25 ug via ORAL
  Filled 2022-08-09: qty 1

## 2022-08-09 MED ORDER — DIPHENHYDRAMINE HCL 50 MG/ML IJ SOLN: 12.5000 mg | INTRAMUSCULAR | Status: DC | PRN

## 2022-08-09 MED ORDER — OXYTOCIN 10 UNIT/ML IJ SOLN
INTRAMUSCULAR | Status: AC
  Filled 2022-08-09: qty 2

## 2022-08-09 MED ORDER — SOD CITRATE-CITRIC ACID 500-334 MG/5ML PO SOLN: 30.0000 mL | ORAL | Status: DC | PRN

## 2022-08-09 MED ORDER — OXYTOCIN BOLUS FROM INFUSION: 333.0000 mL | Freq: Once | INTRAVENOUS | Status: DC

## 2022-08-09 MED ORDER — MISOPROSTOL 200 MCG PO TABS
ORAL_TABLET | ORAL | Status: AC
  Filled 2022-08-09: qty 4

## 2022-08-09 MED ORDER — HYDROXYZINE HCL 25 MG PO TABS: 50.0000 mg | ORAL_TABLET | Freq: Four times a day (QID) | ORAL | Status: DC | PRN

## 2022-08-09 MED ORDER — LIDOCAINE HCL (PF) 1 % IJ SOLN
INTRAMUSCULAR | Status: AC
  Filled 2022-08-09: qty 30

## 2022-08-09 MED ORDER — FENTANYL-BUPIVACAINE-NACL 0.5-0.125-0.9 MG/250ML-% EP SOLN
12.0000 mL/h | EPIDURAL | Status: DC | PRN
  Administered 2022-08-10 (×2): 12 mL/h via EPIDURAL
  Filled 2022-08-09 (×2): qty 250

## 2022-08-09 MED ORDER — LACTATED RINGERS IV SOLN: INTRAVENOUS | Status: DC

## 2022-08-09 MED ORDER — TERBUTALINE SULFATE 1 MG/ML IJ SOLN: 0.2500 mg | Freq: Once | INTRAMUSCULAR | Status: DC | PRN

## 2022-08-09 MED ORDER — LIDOCAINE HCL (PF) 1 % IJ SOLN: 30.0000 mL | INTRAMUSCULAR | Status: DC | PRN

## 2022-08-09 MED ORDER — LACTATED RINGERS IV SOLN
500.0000 mL | Freq: Once | INTRAVENOUS | Status: AC
  Administered 2022-08-10: 500 mL via INTRAVENOUS

## 2022-08-09 MED ORDER — AMMONIA AROMATIC IN INHA
RESPIRATORY_TRACT | Status: AC
  Filled 2022-08-09: qty 10

## 2022-08-09 MED ORDER — PHENYLEPHRINE 80 MCG/ML (10ML) SYRINGE FOR IV PUSH (FOR BLOOD PRESSURE SUPPORT): 80.0000 ug | PREFILLED_SYRINGE | INTRAVENOUS | Status: DC | PRN

## 2022-08-09 MED ORDER — ONDANSETRON HCL 4 MG/2ML IJ SOLN
4.0000 mg | Freq: Four times a day (QID) | INTRAMUSCULAR | Status: DC | PRN
  Administered 2022-08-09 – 2022-08-10 (×2): 4 mg via INTRAVENOUS
  Filled 2022-08-09 (×2): qty 2

## 2022-08-09 MED ORDER — LACTATED RINGERS IV SOLN
500.0000 mL | INTRAVENOUS | Status: DC | PRN
  Administered 2022-08-09 (×2): 1000 mL via INTRAVENOUS

## 2022-08-09 MED ORDER — EPHEDRINE 5 MG/ML INJ: 10.0000 mg | INTRAVENOUS | Status: DC | PRN

## 2022-08-09 MED ORDER — OXYTOCIN-SODIUM CHLORIDE 30-0.9 UT/500ML-% IV SOLN
INTRAVENOUS | Status: AC
  Filled 2022-08-09: qty 500

## 2022-08-09 MED ORDER — MISOPROSTOL 50MCG HALF TABLET
50.0000 ug | ORAL_TABLET | ORAL | Status: DC | PRN
  Administered 2022-08-09 (×3): 50 ug via VAGINAL
  Filled 2022-08-09 (×4): qty 1

## 2022-08-09 MED ORDER — MISOPROSTOL 50MCG HALF TABLET
50.0000 ug | ORAL_TABLET | Freq: Once | ORAL | Status: AC
  Filled 2022-08-09: qty 1

## 2022-08-09 MED ORDER — FENTANYL CITRATE (PF) 100 MCG/2ML IJ SOLN
50.0000 ug | INTRAMUSCULAR | Status: DC | PRN
  Administered 2022-08-09: 100 ug via INTRAVENOUS
  Administered 2022-08-09: 50 ug via INTRAVENOUS
  Filled 2022-08-09 (×2): qty 2

## 2022-08-09 MED ORDER — OXYTOCIN-SODIUM CHLORIDE 30-0.9 UT/500ML-% IV SOLN
1.0000 m[IU]/min | INTRAVENOUS | Status: DC
  Administered 2022-08-09: 2 m[IU]/min via INTRAVENOUS
  Filled 2022-08-09: qty 500

## 2022-08-09 NOTE — Progress Notes (Signed)
Subjective:  Feeling contraction pain. Family at her side.   Objective:   Vitals: Blood pressure (!) 154/92, pulse (!) 124, temperature 98 F (36.7 C), temperature source Oral, resp. rate 19, height 5\' 1"  (1.549 m), weight 98 kg, last menstrual period 11/05/2021. General: NAD Abdomen:non tender  Cervical Exam:  Dilation: 3.5 Effacement (%): 50 Station: -3 Presentation: Vertex Exam by:: L. Jhania Etherington CNM  FHT: baseline 170, moderate variability, pos accel occasional variable present  Toco:q 1-3  Results for orders placed or performed during the hospital encounter of 08/09/22 (from the past 24 hour(s))  CBC     Status: Abnormal   Collection Time: 08/09/22  9:02 AM  Result Value Ref Range   WBC 9.9 4.0 - 10.5 K/uL   RBC 4.52 3.87 - 5.11 MIL/uL   Hemoglobin 11.4 (L) 12.0 - 15.0 g/dL   HCT 28.4 (L) 13.2 - 44.0 %   MCV 78.8 (L) 80.0 - 100.0 fL   MCH 25.2 (L) 26.0 - 34.0 pg   MCHC 32.0 30.0 - 36.0 g/dL   RDW 10.2 (H) 72.5 - 36.6 %   Platelets 190 150 - 400 K/uL   nRBC 0.0 0.0 - 0.2 %  Type and screen     Status: None   Collection Time: 08/09/22  9:02 AM  Result Value Ref Range   ABO/RH(D) O POS    Antibody Screen NEG    Sample Expiration      08/12/2022,2359 Performed at [facility], [address] [place], [place] [address]   Comprehensive metabolic panel     Status: Abnormal   Collection Time: 08/09/22  9:02 AM  Result Value Ref Range   Sodium 136 135 - 145 mmol/L   Potassium 3.4 (L) 3.5 - 5.1 mmol/L   Chloride 106 98 - 111 mmol/L   CO2 18 (L) 22 - 32 mmol/L   Glucose, Bld 152 (H) 70 - 99 mg/dL   BUN 6 6 - 20 mg/dL   Creatinine, Ser 7.42 0.44 - 1.00 mg/dL   Calcium 8.5 (L) 8.9 - 10.3 mg/dL   Total Protein 5.9 (L) 6.5 - 8.1 g/dL   Albumin 3.0 (L) 3.5 - 5.0 g/dL   AST 30 15 - 41 U/L   ALT 15 0 - 44 U/L   Alkaline Phosphatase 136 (H) 38 - 126 U/L   Total Bilirubin 0.6 0.3 - 1.2 mg/dL   GFR, Estimated >59 >56 mL/min   Anion gap 12 5 - 15  Protein /  creatinine ratio, urine     Status: None   Collection Time: 08/09/22  9:02 AM  Result Value Ref Range   Creatinine, Urine 94 mg/dL   Total Protein, Urine 11 mg/dL   Protein Creatinine Ratio 0.12 0.00 - 0.15 mg/mg[Cre]  ABO/Rh     Status: None   Collection Time: 08/09/22  9:58 AM  Result Value Ref Range   ABO/RH(D)      O POS Performed at [facility], [address], [place], [place] [address]     Assessment:   22 y.o. G1P0 [redacted]w[redacted]d admitted for IOL secondary to [facility]  Plan:   1) Labor -received cytotec PV and Cytoec oral at 0920, at 1329 and 1804, cook/s catheter present-tension added, cervix feels about 3-4cm with balloon present. Will start Pitocin at 2200.   2) Fetus - category II tracing, IVF bolus infused   3) GBS negative, membranes intact  4) GHTN: BP's mild to normal range, labs WNL  5) Pain management: received Fentanyl at 1332 and 1808   Dr Valentino Saxon aware of fetal tachycardia    Carie Caddy, CNM  [facility]  08/09/2022 9:10 PM

## 2022-08-09 NOTE — H&P (Signed)
OB History & Physical   History of Present Illness:  Chief Complaint:   HPI:  Pamela Salas is a 22 y.o. G1P0 female at [redacted]w[redacted]d dated by L and 10wks [place].  She presents to L&D for IOL secondary to [facility].  She had early and regular prenatal care. Around 38wks she was diagnosed with GTHN.     Pregnancy Issues: 1. GHTN 2. Anxiety, no meds 3. BMI 40   Maternal Medical History:   Past Medical History:  Diagnosis Date   Anxiety    Breast mass    Ovarian cyst    age 14    Past Surgical History:  Procedure Laterality Date   NO PAST SURGERIES      No Known Allergies  Prior to Admission medications   Medication Sig Start Date End Date Taking? Authorizing Provider  aspirin EC 81 MG tablet Take 1 tablet (81 mg total) by mouth daily. Swallow whole. Start at 14 weeks daily 01/23/22  Yes Doreene Burke, CNM  doxylamine, Sleep, (UNISOM) 25 MG tablet Take 25 mg by mouth at bedtime as needed for sleep.   Yes [provider]  Prenatal Vit-Fe Fumarate-FA (MULTIVITAMIN-PRENATAL) 27-0.8 MG TABS tablet Take 1 tablet by mouth daily at 12 noon.   Yes [provider]  buPROPion (WELLBUTRIN SR) 150 MG 12 hr tablet TAKE 1 TABLET BY MOUTH EVERY DAY Patient not taking: Reported on 07/24/2022 12/06/21   Alfredia Ferguson, PA-C     Prenatal care site: [facility]   Social History: She  reports that she has never smoked. She has never used smokeless tobacco. She reports that she does not drink alcohol and does not use drugs.  Family History: family history includes Aneurysm in her maternal grandmother; COPD in her maternal grandmother; Cancer in her father and paternal grandmother; Cancer (age of onset: 55) in her mother; Colon cancer in her maternal grandfather; Depression in her maternal grandmother; Diabetes in her maternal grandmother; Fibromyalgia in her mother; Healthy in her brother, father, paternal grandfather, and sister; Lung cancer in her maternal grandfather; Thyroid  disease in her maternal grandmother.   Review of Systems: A full review of systems was performed and negative except as noted in the HPI.     Physical Exam:  Vital Signs: BP 137/86 (BP Location: Right Arm)   Pulse (!) 124   Resp 18   LMP 11/05/2021 (Exact Date)  General: no acute distress.  HEENT: normocephalic, atraumatic Heart: regular rate & rhythm.  No murmurs/rubs/gallops Lungs: clear to auscultation bilaterally, normal respiratory effort Abdomen: soft, gravid, non-tender;  EFW: 8lbs Pelvic:   External: Normal external female genitalia  Cervix: Dilation: Fingertip / Effacement (%): 50 / Station: -3    Extremities: non-tender, symmetric, +@ edema bilaterally.  DTRs: +1  Neurologic: Alert & oriented x 3.    Results for orders placed or performed during the hospital encounter of 08/09/22 (from the past 24 hour(s))  CBC     Status: Abnormal   Collection Time: 08/09/22  9:02 AM  Result Value Ref Range   WBC 9.9 4.0 - 10.5 K/uL   RBC 4.52 3.87 - 5.11 MIL/uL   Hemoglobin 11.4 (L) 12.0 - 15.0 g/dL   HCT 16.1 (L) 09.6 - 04.5 %   MCV 78.8 (L) 80.0 - 100.0 fL   MCH 25.2 (L) 26.0 - 34.0 pg   MCHC 32.0 30.0 - 36.0 g/dL   RDW 40.9 (H) 81.1 - 91.4 %   Platelets 190 150 - 400 K/uL  nRBC 0.0 0.0 - 0.2 %  Type and screen     Status: None   Collection Time: 08/09/22  9:02 AM  Result Value Ref Range   ABO/RH(D) O POS    Antibody Screen NEG    Sample Expiration      08/12/2022,2359 Performed at [facility], [address] [place], [place] [address]   Protein / creatinine ratio, urine     Status: None   Collection Time: 08/09/22  9:02 AM  Result Value Ref Range   Creatinine, Urine 94 mg/dL   Total Protein, Urine 11 mg/dL   Protein Creatinine Ratio 0.12 0.00 - 0.15 mg/mg[Cre]  ABO/Rh     Status: None (Preliminary result)   Collection Time: 08/09/22  9:58 AM  Result Value Ref Range   ABO/RH(D) PENDING     Pertinent Results:  Prenatal Labs: Blood type/Rh O Positive    Antibody screen neg  Rubella Immune  Varicella Immune  RPR NR  HBsAg Neg  HIV NR  GC neg  Chlamydia neg  Genetic screening negative  1 hour GTT 137  3 hour GTT   GBS Negative    FHT: baseline 155, moderate variability, pos accel, negative decel Period of fetal tachycardia noted, resolved with IVF bolus  TOCO: rare  SVE:  Dilation: Fingertip / Effacement (%): 50 / Station: -3    Cephalic by leopolds  No results found.  Assessment:  Pamela Salas is a 22 y.o. G1P0 female at [redacted]w[redacted]d with IOL secondary to [facility].   Plan:  Admit to Labor & Delivery Cytotec placed at 0920, cook's balloon easily inserted just now with 80ml and 80ml CBC, T&S, Reg diet, IVF GBS  negative, membranes intact Consents obtained. Continuous efm/toco Paini management: planning epidural GHTN: BP mild to normal range,  UPC 0.12, CMP pending, platelets 190,   Dr Valentino Saxon aware of admission and plan  ----- Carie Caddy, CNM  [place]

## 2022-08-10 ENCOUNTER — Encounter
Admission: RE | Disposition: A | Discharge status: Home / Self Care | Payer: Self-pay | Source: Ambulatory Visit | Attending: Obstetrics

## 2022-08-10 ENCOUNTER — Inpatient Hospital Stay: Payer: BC Managed Care – PPO | Admitting: Anesthesiology

## 2022-08-10 ENCOUNTER — Encounter: Payer: Self-pay | Admitting: Obstetrics and Gynecology

## 2022-08-10 DIAGNOSIS — O134 Gestational [pregnancy-induced] hypertension without significant proteinuria, complicating childbirth: Principal | ICD-10-CM

## 2022-08-10 DIAGNOSIS — Z3A39 39 weeks gestation of pregnancy: Secondary | ICD-10-CM

## 2022-08-10 DIAGNOSIS — O99345 Other mental disorders complicating the puerperium: Secondary | ICD-10-CM

## 2022-08-10 DIAGNOSIS — F411 Generalized anxiety disorder: Secondary | ICD-10-CM

## 2022-08-10 DIAGNOSIS — D62 Acute posthemorrhagic anemia: Secondary | ICD-10-CM

## 2022-08-10 DIAGNOSIS — O3663X0 Maternal care for excessive fetal growth, third trimester, not applicable or unspecified: Secondary | ICD-10-CM

## 2022-08-10 DIAGNOSIS — O9903 Anemia complicating the puerperium: Secondary | ICD-10-CM

## 2022-08-10 LAB — RPR: RPR Ser Ql: NONREACTIVE

## 2022-08-10 SURGERY — Surgical Case
Anesthesia: Epidural

## 2022-08-10 MED ORDER — LIDOCAINE 5 % EX PTCH
1.0000 | MEDICATED_PATCH | CUTANEOUS | Status: DC
  Administered 2022-08-11: 1 via TRANSDERMAL
  Filled 2022-08-10 (×2): qty 1

## 2022-08-10 MED ORDER — MORPHINE SULFATE (PF) 0.5 MG/ML IJ SOLN: INTRAMUSCULAR | Status: DC | PRN

## 2022-08-10 MED ORDER — SOD CITRATE-CITRIC ACID 500-334 MG/5ML PO SOLN
ORAL | Status: AC
  Filled 2022-08-10: qty 15

## 2022-08-10 MED ORDER — BUPIVACAINE IN DEXTROSE 0.75-8.25 % IT SOLN
INTRATHECAL | Status: DC | PRN
  Administered 2022-08-10: 1.6 mL via INTRATHECAL

## 2022-08-10 MED ORDER — NALOXONE HCL 4 MG/10ML IJ SOLN: 1.0000 ug/kg/h | INTRAVENOUS | Status: DC | PRN

## 2022-08-10 MED ORDER — DIPHENHYDRAMINE HCL 50 MG/ML IJ SOLN
12.5000 mg | INTRAMUSCULAR | Status: DC | PRN
  Administered 2022-08-10: 12.5 mg via INTRAVENOUS
  Filled 2022-08-10: qty 1

## 2022-08-10 MED ORDER — MORPHINE SULFATE (PF) 0.5 MG/ML IJ SOLN
INTRAMUSCULAR | Status: DC | PRN
  Administered 2022-08-10: .1 mg via INTRATHECAL

## 2022-08-10 MED ORDER — SODIUM CHLORIDE 0.9 % IV SOLN
500.0000 mg | INTRAVENOUS | Status: AC
  Administered 2022-08-10: 500 mg via INTRAVENOUS
  Filled 2022-08-10: qty 5

## 2022-08-10 MED ORDER — DEXAMETHASONE SODIUM PHOSPHATE 10 MG/ML IJ SOLN
INTRAMUSCULAR | Status: AC
  Filled 2022-08-10: qty 1

## 2022-08-10 MED ORDER — ONDANSETRON HCL 4 MG/2ML IJ SOLN
INTRAMUSCULAR | Status: AC
  Filled 2022-08-10: qty 2

## 2022-08-10 MED ORDER — CHLORHEXIDINE GLUCONATE 0.12 % MT SOLN
OROMUCOSAL | Status: AC
  Administered 2022-08-10: 15 mL
  Filled 2022-08-10: qty 15

## 2022-08-10 MED ORDER — SODIUM CHLORIDE 0.9 % IV SOLN
INTRAVENOUS | Status: DC | PRN
  Administered 2022-08-10: 5 mL via EPIDURAL

## 2022-08-10 MED ORDER — FENTANYL CITRATE (PF) 100 MCG/2ML IJ SOLN
INTRAMUSCULAR | Status: DC | PRN
  Administered 2022-08-10: 15 ug via INTRATHECAL

## 2022-08-10 MED ORDER — ONDANSETRON HCL 4 MG/2ML IJ SOLN: 4.0000 mg | Freq: Three times a day (TID) | INTRAMUSCULAR | Status: DC | PRN

## 2022-08-10 MED ORDER — OXYTOCIN-SODIUM CHLORIDE 30-0.9 UT/500ML-% IV SOLN
2.5000 [IU]/h | INTRAVENOUS | Status: AC
  Administered 2022-08-10: 2.5 [IU]/h via INTRAVENOUS

## 2022-08-10 MED ORDER — ACETAMINOPHEN 500 MG PO TABS: 1000.0000 mg | ORAL_TABLET | Freq: Four times a day (QID) | ORAL | Status: DC

## 2022-08-10 MED ORDER — PHENYLEPHRINE HCL-NACL 20-0.9 MG/250ML-% IV SOLN
INTRAVENOUS | Status: DC | PRN
  Administered 2022-08-10: 50 ug/min via INTRAVENOUS

## 2022-08-10 MED ORDER — CEFAZOLIN SODIUM-DEXTROSE 2-4 GM/100ML-% IV SOLN
2.0000 g | INTRAVENOUS | Status: AC
  Administered 2022-08-10: 2 g via INTRAVENOUS
  Filled 2022-08-10: qty 100

## 2022-08-10 MED ORDER — ACETAMINOPHEN 10 MG/ML IV SOLN
INTRAVENOUS | Status: AC
  Filled 2022-08-10: qty 100

## 2022-08-10 MED ORDER — TRANEXAMIC ACID-NACL 1000-0.7 MG/100ML-% IV SOLN
INTRAVENOUS | Status: AC
  Filled 2022-08-10: qty 100

## 2022-08-10 MED ORDER — KETOROLAC TROMETHAMINE 30 MG/ML IJ SOLN: 30.0000 mg | Freq: Four times a day (QID) | INTRAMUSCULAR | Status: DC | PRN

## 2022-08-10 MED ORDER — OXYTOCIN-SODIUM CHLORIDE 30-0.9 UT/500ML-% IV SOLN
INTRAVENOUS | Status: DC | PRN
  Administered 2022-08-10: 30 [IU] via INTRAVENOUS

## 2022-08-10 MED ORDER — TRANEXAMIC ACID 1000 MG/10ML IV SOLN
INTRAVENOUS | Status: DC | PRN
  Administered 2022-08-10: 1000 mg via INTRAVENOUS

## 2022-08-10 MED ORDER — KETOROLAC TROMETHAMINE 30 MG/ML IJ SOLN
INTRAMUSCULAR | Status: DC | PRN
  Administered 2022-08-10: 30 mg via INTRAVENOUS

## 2022-08-10 MED ORDER — CALCIUM CARBONATE ANTACID 500 MG PO CHEW
2.0000 | CHEWABLE_TABLET | ORAL | Status: DC | PRN
  Administered 2022-08-10: 400 mg via ORAL
  Filled 2022-08-10: qty 2

## 2022-08-10 MED ORDER — LIDOCAINE 5 % EX PTCH: MEDICATED_PATCH | CUTANEOUS | Status: DC | PRN

## 2022-08-10 MED ORDER — MORPHINE SULFATE (PF) 0.5 MG/ML IJ SOLN
INTRAMUSCULAR | Status: AC
  Filled 2022-08-10: qty 10

## 2022-08-10 MED ORDER — ONDANSETRON HCL 4 MG/2ML IJ SOLN
INTRAMUSCULAR | Status: DC | PRN
  Administered 2022-08-10: 4 mg via INTRAVENOUS

## 2022-08-10 MED ORDER — OXYCODONE HCL 5 MG PO TABS: 5.0000 mg | ORAL_TABLET | Freq: Four times a day (QID) | ORAL | Status: DC | PRN

## 2022-08-10 MED ORDER — BUPIVACAINE HCL (PF) 0.25 % IJ SOLN
INTRAMUSCULAR | Status: DC | PRN
  Administered 2022-08-10 (×2): 5 mL via EPIDURAL

## 2022-08-10 MED ORDER — SOD CITRATE-CITRIC ACID 500-334 MG/5ML PO SOLN
30.0000 mL | ORAL | Status: AC
  Administered 2022-08-10: 30 mL via ORAL

## 2022-08-10 MED ORDER — SCOPOLAMINE 1 MG/3DAYS TD PT72: 1.0000 | MEDICATED_PATCH | Freq: Once | TRANSDERMAL | Status: DC

## 2022-08-10 MED ORDER — MISOPROSTOL 25 MCG QUARTER TABLET
ORAL_TABLET | ORAL | Status: DC | PRN
  Administered 2022-08-10: 200 ug via RECTAL

## 2022-08-10 MED ORDER — DEXAMETHASONE SODIUM PHOSPHATE 10 MG/ML IJ SOLN
INTRAMUSCULAR | Status: DC | PRN
  Administered 2022-08-10: 10 mg via INTRAVENOUS

## 2022-08-10 MED ORDER — OXYTOCIN-SODIUM CHLORIDE 30-0.9 UT/500ML-% IV SOLN: 1.0000 m[IU]/min | INTRAVENOUS | Status: DC

## 2022-08-10 MED ORDER — DIPHENHYDRAMINE HCL 25 MG PO CAPS: 25.0000 mg | ORAL_CAPSULE | ORAL | Status: DC | PRN

## 2022-08-10 MED ORDER — FENTANYL CITRATE (PF) 100 MCG/2ML IJ SOLN
INTRAMUSCULAR | Status: AC
  Filled 2022-08-10: qty 2

## 2022-08-10 MED ORDER — ACETAMINOPHEN 10 MG/ML IV SOLN
INTRAVENOUS | Status: DC | PRN
  Administered 2022-08-10: 1000 mg via INTRAVENOUS

## 2022-08-10 MED ORDER — SOD CITRATE-CITRIC ACID 500-334 MG/5ML PO SOLN: 30.0000 mL | ORAL | Status: DC

## 2022-08-10 MED ORDER — SODIUM CHLORIDE 0.9 % IV SOLN: 500.0000 mg | INTRAVENOUS | Status: DC

## 2022-08-10 MED ORDER — PHENYLEPHRINE HCL-NACL 20-0.9 MG/250ML-% IV SOLN
INTRAVENOUS | Status: AC
  Filled 2022-08-10: qty 250

## 2022-08-10 MED ORDER — CEFAZOLIN SODIUM-DEXTROSE 2-4 GM/100ML-% IV SOLN: 2.0000 g | INTRAVENOUS | Status: DC

## 2022-08-10 MED ORDER — EPHEDRINE SULFATE-NACL 50-0.9 MG/10ML-% IV SOSY
PREFILLED_SYRINGE | INTRAVENOUS | Status: DC | PRN
  Administered 2022-08-10 (×2): 5 mg via INTRAVENOUS

## 2022-08-10 MED ORDER — MEPERIDINE HCL 25 MG/ML IJ SOLN: 6.2500 mg | INTRAMUSCULAR | Status: DC | PRN

## 2022-08-10 MED ORDER — LIDOCAINE-EPINEPHRINE (PF) 1.5 %-1:200000 IJ SOLN
INTRAMUSCULAR | Status: DC | PRN
  Administered 2022-08-10: 3 mL via EPIDURAL

## 2022-08-10 MED ORDER — LIDOCAINE HCL (PF) 1 % IJ SOLN
INTRAMUSCULAR | Status: DC | PRN
  Administered 2022-08-10: 3 mL via SUBCUTANEOUS

## 2022-08-10 MED ORDER — NALOXONE HCL 0.4 MG/ML IJ SOLN: 0.4000 mg | INTRAMUSCULAR | Status: DC | PRN

## 2022-08-10 MED ORDER — EPHEDRINE 5 MG/ML INJ
INTRAVENOUS | Status: AC
  Filled 2022-08-10: qty 5

## 2022-08-10 MED ORDER — PHENYLEPHRINE HCL (PRESSORS) 10 MG/ML IV SOLN
INTRAVENOUS | Status: DC | PRN
  Administered 2022-08-10 (×3): 160 ug via INTRAVENOUS

## 2022-08-10 MED ORDER — SODIUM CHLORIDE 0.9% FLUSH: 3.0000 mL | INTRAVENOUS | Status: DC | PRN

## 2022-08-10 MED ORDER — OXYTOCIN-SODIUM CHLORIDE 30-0.9 UT/500ML-% IV SOLN
INTRAVENOUS | Status: AC
  Filled 2022-08-10: qty 500

## 2022-08-10 SURGICAL SUPPLY — 35 items
APL PRP STRL LF DISP 70% ISPRP (MISCELLANEOUS) ×2
APL SKNCLS STERI-STRIP NONHPOA (GAUZE/BANDAGES/DRESSINGS) ×1
BAG COUNTER SPONGE SURGICOUNT (BAG) ×1 IMPLANT
BAG SPNG CNTER NS LX DISP (BAG) ×1
BENZOIN TINCTURE PRP APPL 2/3 (GAUZE/BANDAGES/DRESSINGS) IMPLANT
CHLORAPREP W/TINT 26 (MISCELLANEOUS) ×2 IMPLANT
DRSG TELFA 3X4 N-ADH STERILE (GAUZE/BANDAGES/DRESSINGS) IMPLANT
DRSG TELFA 3X8 NADH STRL (GAUZE/BANDAGES/DRESSINGS) ×1 IMPLANT
ELECT REM PT RETURN 9FT ADLT (ELECTROSURGICAL) ×1
ELECTRODE REM PT RTRN 9FT ADLT (ELECTROSURGICAL) ×1 IMPLANT
EXTRT SYSTEM ALEXIS 17CM (MISCELLANEOUS)
GAUZE SPONGE 4X4 12PLY STRL (GAUZE/BANDAGES/DRESSINGS) ×1 IMPLANT
GLOVE BIO SURGEON STRL SZ 6.5 (GLOVE) ×1 IMPLANT
GLOVE INDICATOR 7.0 STRL GRN (GLOVE) ×1 IMPLANT
GOWN STRL REUS W/ TWL LRG LVL3 (GOWN DISPOSABLE) ×2 IMPLANT
GOWN STRL REUS W/TWL LRG LVL3 (GOWN DISPOSABLE) ×2
KIT TURNOVER KIT A (KITS) ×1 IMPLANT
MANIFOLD NEPTUNE II (INSTRUMENTS) ×1 IMPLANT
MAT PREVALON FULL STRYKER (MISCELLANEOUS) ×1 IMPLANT
NS IRRIG 1000ML POUR BTL (IV SOLUTION) ×1 IMPLANT
PACK C SECTION AR (MISCELLANEOUS) ×1 IMPLANT
PAD OB MATERNITY 4.3X12.25 (PERSONAL CARE ITEMS) ×1 IMPLANT
PAD PREP OB/GYN DISP 24X41 (PERSONAL CARE ITEMS) ×1 IMPLANT
SCRUB CHG 4% DYNA-HEX 4OZ (MISCELLANEOUS) ×1 IMPLANT
SPONGE GAUZE 4X4 STERILE 39 (GAUZE/BANDAGES/DRESSINGS) IMPLANT
STRIP CLOSURE SKIN 1/2X4 (GAUZE/BANDAGES/DRESSINGS) IMPLANT
SUT MNCRL AB 4-0 PS2 18 (SUTURE) ×1 IMPLANT
SUT PLAIN 2 0 XLH (SUTURE) IMPLANT
SUT VIC AB 0 CT1 36 (SUTURE) ×4 IMPLANT
SUT VIC AB 3-0 SH 27 (SUTURE) ×1
SUT VIC AB 3-0 SH 27X BRD (SUTURE) ×1 IMPLANT
SYSTEM CONTND EXTRCTN KII BLLN (MISCELLANEOUS) IMPLANT
TAPE PAPER 3X10 WHT MICROPORE (GAUZE/BANDAGES/DRESSINGS) IMPLANT
TRAP FLUID SMOKE EVACUATOR (MISCELLANEOUS) ×1 IMPLANT
WATER STERILE IRR 500ML POUR (IV SOLUTION) ×1 IMPLANT

## 2022-08-10 NOTE — Progress Notes (Addendum)
LABOR NOTE   SUBJECTIVE:   Pamela Salas is a 22 y.o.  G1P0  at [redacted]w[redacted]d whose labor is being induced for [facility]. She is currently receiving 28 mU/min of Pitocin and has not achieved adequate MVUs. [name] is exhausted and not getting adequate pain relief with her epidural. She is requesting a cervical exam.   Analgesia: Epidural  OBJECTIVE:  BP 139/75   Pulse (!) 134   Temp 99.6 F (37.6 C) (Oral)   Resp 18   Ht 5\' 1"  (1.549 m)   Wt 98 kg   LMP 11/05/2021 (Exact Date)   SpO2 100%   BMI 40.81 kg/m  No intake/output data recorded.  SVE:   Dilation: 5 Effacement (%): 90 Station: 0 Exam by:: Kellyjo Edgren CNM CONTRACTIONS: regular, every 2-4 minutes FHR: Fetal heart tracing reviewed. Baseline: 160 Variability: moderate Accelerations: present Decelerations:none Category 1  Labs: Lab Results  Component Value Date   WBC 9.9 08/09/2022   HGB 11.4 (L) 08/09/2022   HCT 35.6 (L) 08/09/2022   MCV 78.8 (L) 08/09/2022   PLT 190 08/09/2022    ASSESSMENT: 1) IOL for GHTN.  2) Arrest of dilation at 5 cm 3) Maternal exhaustion  Principal Problem:   Encounter for induction of labor   PLAN: Proceed with Cesarean birth  Pitocin discontinued Dr. Valentino Saxon notified  Will prepare for delivery  [name], [facility] 08/10/2022 7:32 PM

## 2022-08-10 NOTE — Progress Notes (Signed)
Pain with contractions. No motor block. VSS. Will bolus as charted. Level to ice T10 bilaterally Nelta Numbers ANES

## 2022-08-10 NOTE — Progress Notes (Signed)
Swanson CNM confirmed vertex by bedside U/S

## 2022-08-10 NOTE — Anesthesia Preprocedure Evaluation (Addendum)
Anesthesia Evaluation  Patient identified by MRN, date of birth, ID band Patient awake    Reviewed: Allergy & Precautions, NPO status , Patient's Chart, lab work & pertinent test results  History of Anesthesia Complications Negative for: history of anesthetic complications  Airway Mallampati: III  TM Distance: >3 FB Neck ROM: Full    Dental no notable dental hx. (+) Teeth Intact   Pulmonary neg pulmonary ROS, neg sleep apnea, neg COPD, Patient abstained from smoking.Not current smoker   Pulmonary exam normal breath sounds clear to auscultation       Cardiovascular Exercise Tolerance: Good METShypertension, (-) CAD and (-) Past MI (-) dysrhythmias  Rhythm:Regular Rate:Normal - Systolic murmurs    Neuro/Psych  PSYCHIATRIC DISORDERS Anxiety     negative neurological ROS  negative psych ROS   GI/Hepatic ,neg GERD  ,,(+)     (-) substance abuse    Endo/Other  neg diabetes  Morbid obesity  Renal/GU negative Renal ROS     Musculoskeletal   Abdominal  (+) + obese  Peds  Hematology   Anesthesia Other Findings Past Medical History: No date: Anxiety No date: Breast mass No date: Ovarian cyst     Comment:  age 22  Reproductive/Obstetrics (+) Pregnancy                             Anesthesia Physical Anesthesia Plan  ASA: 3  Anesthesia Plan: Epidural and Spinal   Post-op Pain Management:    Induction:   PONV Risk Score and Plan: 2 and Treatment may vary due to age or medical condition and Ondansetron  Airway Management Planned: Natural Airway  Additional Equipment:   Intra-op Plan:   Post-operative Plan:   Informed Consent: I have reviewed the patients History and Physical, chart, labs and discussed the procedure including the risks, benefits and alternatives for the proposed anesthesia with the patient or authorized representative who has indicated his/her understanding and  acceptance.       Plan Discussed with: Surgeon  Anesthesia Plan Comments: (Update: Pt in need a cesarean section for failed induction. Epidural is not performing well with breakthrough pain treated with .25% bupi. Will pull and perform a spinal. Consent obtained- Nelta Numbers  Discussed R/B/A of neuraxial anesthesia technique with patient: - rare risks of spinal/epidural hematoma, nerve damage, infection - Risk of PDPH - Risk of itching - Risk of nausea and vomiting - Risk of poor block necessitating replacement of epidural. - Risk of allergic reactions. Patient voiced understanding.)        Anesthesia Quick Evaluation

## 2022-08-10 NOTE — Progress Notes (Signed)
LABOR NOTE   SUBJECTIVE:   Pamela Salas is a 22 y.o.  G1P0  at [redacted]w[redacted]d whose labor is being induced fpr GHTN. She received misopostrol and a Cook catheter yesterday. She is now on Pitocin at 10 mU/min of Pitocin. She is comfortable with her epidural. Attempted AROM - unsuccessful.   Analgesia: Epidural  OBJECTIVE:  BP 133/80   Pulse (!) 119   Temp 98.2 F (36.8 C) (Oral)   Resp 18   Ht 5\' 1"  (1.549 m)   Wt 98 kg   LMP 11/05/2021 (Exact Date)   SpO2 100%   BMI 40.81 kg/m  Total I/O In: -  Out: 300 [Urine:300]  SVE:   Dilation: 4 Effacement (%): 60 Station: -3 Exam by:: K. Cevallos RN CONTRACTIONS: some difficulty tracing contractions - approximately q2 min FHR: Fetal heart tracing reviewed. Baseline: 145 Variability: moderate Accelerations: present Decelerations:none Category 1  Labs: Lab Results  Component Value Date   WBC 9.9 08/09/2022   HGB 11.4 (L) 08/09/2022   HCT 35.6 (L) 08/09/2022   MCV 78.8 (L) 08/09/2022   PLT 190 08/09/2022    ASSESSMENT: -Induction of labor for GHTN - slow progress -Coping well -Membranes intact  Principal Problem:   Encounter for induction of labor   PLAN: Continue to titrate Pitocin  Dr. Valentino Saxon to AROM and place IUPC Anticipate NSVD   [name], CNM 08/10/2022 8:48 AM

## 2022-08-10 NOTE — Progress Notes (Signed)
    OB/GYN ATTENDING PROGRESS NOTE    Pamela Salas is a 22 y.o. G1P0 at [redacted]w[redacted]d who is currently being induced for GHTN at term, admitted 1 day ago.  Patient with failed IOL after several doses of Cytotec, Cook's catheter, and Pitocin up to 28 mIU of Pitocin. Cervix 5/90/-0.  Patient very uncomfortable even with epidural. Declines further induction attempts.  The risks of surgery were discussed with the patient including but were not limited to: bleeding which may require transfusion or reoperation; infection which may require antibiotics; injury to bowel, bladder, ureters or other surrounding organs; injury to the fetus; need for additional procedures including hysterectomy in the event of a life-threatening hemorrhage; formation of adhesions; placental abnormalities with subsequent pregnancies; incisional problems; thromboembolic phenomenon and other postoperative/anesthesia complications.  The patient concurred with the proposed plan, giving informed written consent for the procedure.   Patient  will remain NPO for procedure. Anesthesia and OR aware. Preoperative prophylactic antibiotics and SCDs ordered on call to the OR.  To OR when ready.     Hildred Laser, MD  OB/GYN

## 2022-08-10 NOTE — Plan of Care (Signed)
  Problem: Education: Goal: Knowledge of Childbirth will improve Outcome: Completed/Met Goal: Ability to make informed decisions regarding treatment and plan of care will improve Outcome: Completed/Met Goal: Ability to state and carry out methods to decrease the pain will improve Outcome: Completed/Met   Problem: Coping: Goal: Ability to verbalize concerns and feelings about labor and delivery will improve Outcome: Completed/Met   Problem: Life Cycle: Goal: Ability to make normal progression through stages of labor will improve Outcome: Completed/Met Goal: Ability to effectively push during vaginal delivery will improve Outcome: Completed/Met   Problem: Role Relationship: Goal: Will demonstrate positive interactions with the child Outcome: Completed/Met   Problem: Safety: Goal: Risk of complications during labor and delivery will decrease Outcome: Completed/Met   Problem: Pain Management: Goal: Relief or control of pain from uterine contractions will improve Outcome: Completed/Met   

## 2022-08-10 NOTE — Anesthesia Procedure Notes (Signed)
Spinal  Patient location during procedure: OR Start time: 08/10/2022 8:32 PM End time: 08/10/2022 8:39 PM Reason for block: surgical anesthesia Staffing Performed: resident/CRNA  Resident/CRNA: Lynden Oxford, CRNA Performed by: Lynden Oxford, CRNA Authorized by: Foye Deer, MD   Preanesthetic Checklist Completed: patient identified, IV checked, site marked, risks and benefits discussed, surgical consent, monitors and equipment checked, pre-op evaluation and timeout performed Spinal Block Patient position: sitting Prep: DuraPrep Patient monitoring: heart rate, cardiac monitor, continuous pulse ox and blood pressure Approach: midline Location: L3-4 Injection technique: single-shot Needle Needle type: Pencan  Needle gauge: 25 G Needle length: 9 cm Assessment Sensory level: T4 Events: CSF return

## 2022-08-10 NOTE — Op Note (Signed)
Cesarean Section Procedure Note  Indications: failed induction  Pre-operative Diagnosis: 39 week 5 day pregnancy, gestational HTN, obesity in pregnancy, failed induction of labor.  Post-operative Diagnosis: Same, with fetal macrosomia, postpartum hemorrhage  Surgeon: Hildred Laser, MD  Assistants:  [name], CNM  Procedure: Primary low transverse Cesarean Section  Anesthesia: Spinal anesthesia  Findings: Female infant, cephalic presentation, 4630 grams, with Apgar scores of 7 at one minute and 9 at five minutes. Intact placenta with 3 vessel cord.  Clear amniotic fluid at amniotomy The uterine outline, tubes and ovaries appeared normal.   Procedure Details: The patient was seen in the Holding Room. The risks, benefits, complications, treatment options, and expected outcomes were discussed with the patient.  The patient concurred with the proposed plan, giving informed consent.  The site of surgery properly noted/marked. The patient was taken to the Operating Room, identified as Pamela Salas and the procedure verified as C-Section Delivery. A Time Out was held and the above information confirmed.  After induction of anesthesia, the patient was draped and prepped in the usual sterile manner. Anesthesia was tested and noted to be adequate. A Pfannenstiel incision was made and carried down through the subcutaneous tissue to the fascia. Fascial incision was made and extended transversely. The fascia was separated from the underlying rectus tissue superiorly and inferiorly. The peritoneum was identified and entered. Peritoneal incision was extended longitudinally. The surgical assist was able to provide retraction to allow for clear visualization of surgical site. An Alexis retractor was placed in the abdomen for additional retraction. The utero-vesical peritoneal reflection was incised transversely and the bladder flap was bluntly freed from the lower uterine segment. A low transverse  uterine incision was made. Delivered from cephalic presentation was a 4630 gram Female with Apgar scores of 7 at one minute and 9 at five minutes.  The assistant was able to apply adequate fundal pressure to allow for successful delivery of the fetus. After the umbilical cord was clamped and cut, cord blood was obtained for evaluation. Delayed cord clamping was observed. The placenta was removed intact and appeared normal. The uterus was exteriorized and cleared of all clots and debris. The uterine outline, tubes and ovaries appeared normal.  The uterine incision was closed with running locked sutures of 0-Vicryl.  A second suture of 0-Vicryl was used in an imbricating layer. The uterus was noted to be boggy, unresolved with uterine massage during hysterotomy repair. A dose of 1 gram of tranexamic acid was administered IV.  Hemostasis was observed. The uterus was then returned to the abdomen. The pericolic gutters were cleared of all clots and debris. The fascia was then reapproximated with a running suture of 0-Vicryl. The subcutaneous fat layer was reapproximated with 2-0 Vicryl. The skin was reapproximated with 4-0 Monocryl. The incision was covered with steri-strips and a pressure dressing.  Cytotec 800 mcg was then placed rectally as uterotonic.   Instrument, sponge, and needle counts were correct prior the abdominal closure and at the conclusion of the case.    An experienced assistant was required given the standard of surgical care given the complexity of the case.  This assistant was needed for exposure, dissection, suctioning, retraction, instrument exchange, and for overall help during the procedure.  Estimated Blood Loss:  1370 ml      Drains: foley catheter to gravity drainage, 150 ml of clear urine at end of the procedure         Total IV Fluids: 1350 ml  Specimens:  Cord blood         Implants: None         Complications:  None; patient tolerated the procedure well.          Disposition: PACU - hemodynamically stable.         Condition: stable   Hildred Laser, MD [facility]

## 2022-08-10 NOTE — Anesthesia Procedure Notes (Signed)
Epidural Patient location during procedure: OB  Staffing Anesthesiologist: Maciej Schweitzer, MD Performed: anesthesiologist   Preanesthetic Checklist Completed: patient identified, IV checked, site marked, risks and benefits discussed, surgical consent, monitors and equipment checked, pre-op evaluation and timeout performed  Epidural Patient position: sitting Prep: ChloraPrep Patient monitoring: heart rate, continuous pulse ox and blood pressure Approach: midline Location: L4-L5 Injection technique: LOR saline  Needle:  Needle type: Tuohy  Needle gauge: 17 G Needle length: 9 cm Needle insertion depth: 5 cm Catheter type: closed end flexible Catheter size: 19 Gauge Catheter at skin depth: 10 cm Test dose: negative and 1.5% lidocaine with Epi 1:200 K  Assessment Sensory level: T10 Events: blood not aspirated, no cerebrospinal fluid, injection not painful, no injection resistance, no paresthesia and negative IV test  Additional Notes First/one attempt Pt. Evaluated and documentation done after procedure finished. Patient identified. Risks/Benefits/Options discussed with patient including but not limited to bleeding, infection, nerve damage, paralysis, failed block, incomplete pain control, headache, blood pressure changes, nausea, vomiting, reactions to medication both or allergic, itching and postpartum back pain. Confirmed with bedside nurse the patient's most recent platelet count. Confirmed with patient that they are not currently taking any anticoagulation, have any bleeding history or any family history of bleeding disorders. Patient expressed understanding and wished to proceed. All questions were answered. Sterile technique was used throughout the entire procedure. Please see nursing notes for vital signs. Test dose was given through epidural catheter and negative prior to continuing to dose epidural or start infusion. Warning signs of high block given to the patient including  shortness of breath, tingling/numbness in hands, complete motor block, or any concerning symptoms with instructions to call for help. Patient was given instructions on fall risk and not to get out of bed. All questions and concerns addressed with instructions to call with any issues or inadequate analgesia.     Patient tolerated the insertion well without immediate complications.  Reason for block: procedure for painReason for block:procedure for pain     

## 2022-08-10 NOTE — Progress Notes (Signed)
LABOR NOTE   SUBJECTIVE:   Pamela Salas is a 22 y.o.  G1P0  at [redacted]w[redacted]d whose labor is being induced for [facility]. She is feeling abdominal pain and rectal pressure. MVUs have not been adequate with Pitocin up to 24 mU/min. She has been in multiple positions to facilitate fetal descent. She was given a 30-minute Pitocin break and then the Pitocin was restarted. Her cervix is more effaced and there has been some fetal descent. There has not been further dilation since the previous exam. Sawsan has been tachycardic but is afebrile. The FHR  baseline is 170 bpm.   Analgesia: Epidural  OBJECTIVE:  BP 138/87   Pulse (!) 132   Temp 98.4 F (36.9 C) (Oral)   Resp 18   Ht 5\' 1"  (1.549 m)   Wt 98 kg   LMP 11/05/2021 (Exact Date)   SpO2 99%   BMI 40.81 kg/m  Total I/O In: -  Out: 1900 [Urine:1900]  SVE:   Dilation: 5 Effacement (%): 90 Station: -1 Exam by:: Quitman Livings, CNM CONTRACTIONS: regular, every 2-3 minutes FHR: Fetal heart tracing reviewed. Baseline:  170 Variability: moderate Accelerations: present Decelerations:none Category 2  Labs: Lab Results  Component Value Date   WBC 9.9 08/09/2022   HGB 11.4 (L) 08/09/2022   HCT 35.6 (L) 08/09/2022   MCV 78.8 (L) 08/09/2022   PLT 190 08/09/2022    ASSESSMENT:   -IOL for GHTN, inadequate contractions and minimal cervical change  -Coping well. Anesthesia notified about incomplete pain relief. -Membranes: ruptured -Fetal tachycardia     Principal Problem:   Encounter for induction of labor   PLAN: -Discussed possibility of cesarean birth with Adraine and her support people.  -Reviewed progress and fetal monitoring strip with Dr. Valentino Saxon. She recommends using high-dose Pitocin titration protocol to 30 mU/min to attempt to achieve adequate contractions or proceeding with cesarean birth now. Masiyah prefers to continue with induction at this time. -Will recheck cervix once adequate contractions have been established. -IV fluid  bolus -Anticipate NSVD   [name], CNM 08/10/2022 5:12 PM

## 2022-08-10 NOTE — Transfer of Care (Signed)
Immediate Anesthesia Transfer of Care Note  Patient: Pamela Salas  Procedure(s) Performed: CESAREAN SECTION  Patient Location: OB High Risk  Anesthesia Type:Epidural  Level of Consciousness: awake, alert , oriented, and patient cooperative  Airway & Oxygen Therapy: Patient Spontanous Breathing  Post-op Assessment: Report given to RN and Post -op Vital signs reviewed and stable  Post vital signs: Reviewed and stable  Last Vitals:  Vitals Value Taken Time  BP 135/90 08/10/22 2200  Temp    Pulse 133 08/10/22 2204  Resp 11 08/10/22 2204  SpO2 97 % 08/10/22 2204  Vitals shown include unvalidated device data.  Last Pain:  Vitals:   08/10/22 1902  TempSrc: Oral  PainSc: 3       Patients Stated Pain Goal: 0 (08/10/22 0314)  Complications: No notable events documented.

## 2022-08-10 NOTE — Progress Notes (Signed)
LABOR NOTE   SUBJECTIVE:   Pamela Salas is a 22 y.o.  G1P0  at [redacted]w[redacted]d whose labor is being induced for [facility]. Her BPs have been normal to mild range this morning. She is feeling rectal pressure with contractions. She is receiving Pitocin at 18 mU/min. 145-150 MVUs.  Analgesia: Epidural  OBJECTIVE:  BP 131/66   Pulse (!) 127   Temp 98.2 F (36.8 C) (Oral)   Resp 18   Ht 5\' 1"  (1.549 m)   Wt 98 kg   LMP 11/05/2021 (Exact Date)   SpO2 97%   BMI 40.81 kg/m  Total I/O In: -  Out: 1500 [Urine:1500]  SVE:    Dilation: 5 Effacement (%): 70 Cervical Position: Posterior Station: -2, -3 Presentation: Vertex Exam by:: Quitman Livings, CNM  CONTRACTIONS: regular, every 2-3 minutes FHR: Fetal heart tracing reviewed. Baseline: 145 Variability: moderate Accelerations: Decelerations:none Category 1  Labs: Lab Results  Component Value Date   WBC 9.9 08/09/2022   HGB 11.4 (L) 08/09/2022   HCT 35.6 (L) 08/09/2022   MCV 78.8 (L) 08/09/2022   PLT 190 08/09/2022    ASSESSMENT: Induction of labor for GHTN , on Pitocin Cervical change since last exam Coping well Membranes: ruptured   Principal Problem:   Encounter for induction of labor   PLAN: -Titrate Pitocin for adequate contractions -Fluid bolus -Anticipate NSVD  Pamela Salas, CNM 08/10/2022 12:42 PM

## 2022-08-10 NOTE — Progress Notes (Signed)
Subjective:  Feeling some contractions. Starting to feel tired. Reviewed labor process, reassured pt of normal pace for an induction.   Objective:   Vitals: Blood pressure (!) 149/87, pulse (!) 108, temperature 97.9 F (36.6 C), temperature source Oral, resp. rate 20, height 5\' 1"  (1.549 m), weight 98 kg, last menstrual period 11/05/2021. General: NAD Abdomen:non tender Cervical Exam:  Dilation: 4 Effacement (%): 60 Station: -3 Presentation: Vertex Exam by:: L Altha Sweitzer CNM  FHT: baseline 150, moderate variability, pos accel, neg decel  Toco:q 1-3 Pitocin at 2 milli-units  Results for orders placed or performed during the hospital encounter of 08/09/22 (from the past 24 hour(s))  CBC     Status: Abnormal   Collection Time: 08/09/22  9:02 AM  Result Value Ref Range   WBC 9.9 4.0 - 10.5 K/uL   RBC 4.52 3.87 - 5.11 MIL/uL   Hemoglobin 11.4 (L) 12.0 - 15.0 g/dL   HCT 24.4 (L) 01.0 - 27.2 %   MCV 78.8 (L) 80.0 - 100.0 fL   MCH 25.2 (L) 26.0 - 34.0 pg   MCHC 32.0 30.0 - 36.0 g/dL   RDW 53.6 (H) 64.4 - 03.4 %   Platelets 190 150 - 400 K/uL   nRBC 0.0 0.0 - 0.2 %  Type and screen     Status: None   Collection Time: 08/09/22  9:02 AM  Result Value Ref Range   ABO/RH(D) O POS    Antibody Screen NEG    Sample Expiration      08/12/2022,2359 Performed at [facility], [address] [place], [place] [address]   Comprehensive metabolic panel     Status: Abnormal   Collection Time: 08/09/22  9:02 AM  Result Value Ref Range   Sodium 136 135 - 145 mmol/L   Potassium 3.4 (L) 3.5 - 5.1 mmol/L   Chloride 106 98 - 111 mmol/L   CO2 18 (L) 22 - 32 mmol/L   Glucose, Bld 152 (H) 70 - 99 mg/dL   BUN 6 6 - 20 mg/dL   Creatinine, Ser 5.63 0.44 - 1.00 mg/dL   Calcium 8.5 (L) 8.9 - 10.3 mg/dL   Total Protein 5.9 (L) 6.5 - 8.1 g/dL   Albumin 3.0 (L) 3.5 - 5.0 g/dL   AST 30 15 - 41 U/L   ALT 15 0 - 44 U/L   Alkaline Phosphatase 136 (H) 38 - 126 U/L   Total Bilirubin 0.6 0.3 -  1.2 mg/dL   GFR, Estimated >87 >56 mL/min   Anion gap 12 5 - 15  Protein / creatinine ratio, urine     Status: None   Collection Time: 08/09/22  9:02 AM  Result Value Ref Range   Creatinine, Urine 94 mg/dL   Total Protein, Urine 11 mg/dL   Protein Creatinine Ratio 0.12 0.00 - 0.15 mg/mg[Cre]  ABO/Rh     Status: None   Collection Time: 08/09/22  9:58 AM  Result Value Ref Range   ABO/RH(D)      O POS Performed at [facility], [address], [place], [place] [address]     Assessment:   22 y.o. G1P0 [redacted]w[redacted]d admitted for IOL secondary to [facility]   Plan:     1) Labor -received cytotec PV and Cytoec oral at 0920, at 1329 and 1804, cook's catheter deflated. VE 4-5 cm, Pitocin infusing    2) Fetus - category I tracing   3) GBS negative, membranes intact   4) GHTN:  BP's mild to normal range, labs WNL   5) Pain management: received Fentanyl at 1332 and 1808   Carie Caddy, CNM   [facility]  08/10/2022 12:36 AM

## 2022-08-10 NOTE — Progress Notes (Signed)
Subjective:  Comfortable with epidural   Objective:   Vitals: Blood pressure 113/64, pulse (!) 111, temperature 98 F (36.7 C), temperature source Oral, resp. rate 20, height 5\' 1"  (1.549 m), weight 98 kg, last menstrual period 11/05/2021, SpO2 96 %. General: NAD Abdomen:non tender  Cervical Exam:  Dilation: 4 Effacement (%): 60 Cervical Position: Middle Station: -3 Presentation: Vertex Exam by:: Yvone Neu RN  FHT: baseline 135, moderate variability, accel present, decel neg  Toco:irritability  Pitocin at 6 milli-units   Results for orders placed or performed during the hospital encounter of 08/09/22 (from the past 24 hour(s))  CBC     Status: Abnormal   Collection Time: 08/09/22  9:02 AM  Result Value Ref Range   WBC 9.9 4.0 - 10.5 K/uL   RBC 4.52 3.87 - 5.11 MIL/uL   Hemoglobin 11.4 (L) 12.0 - 15.0 g/dL   HCT 16.1 (L) 09.6 - 04.5 %   MCV 78.8 (L) 80.0 - 100.0 fL   MCH 25.2 (L) 26.0 - 34.0 pg   MCHC 32.0 30.0 - 36.0 g/dL   RDW 40.9 (H) 81.1 - 91.4 %   Platelets 190 150 - 400 K/uL   nRBC 0.0 0.0 - 0.2 %  Type and screen     Status: None   Collection Time: 08/09/22  9:02 AM  Result Value Ref Range   ABO/RH(D) O POS    Antibody Screen NEG    Sample Expiration      08/12/2022,2359 Performed at [facility], [address] [place], [place] [address]   Comprehensive metabolic panel     Status: Abnormal   Collection Time: 08/09/22  9:02 AM  Result Value Ref Range   Sodium 136 135 - 145 mmol/L   Potassium 3.4 (L) 3.5 - 5.1 mmol/L   Chloride 106 98 - 111 mmol/L   CO2 18 (L) 22 - 32 mmol/L   Glucose, Bld 152 (H) 70 - 99 mg/dL   BUN 6 6 - 20 mg/dL   Creatinine, Ser 6.21 0.44 - 1.00 mg/dL   Calcium 8.5 (L) 8.9 - 10.3 mg/dL   Total Protein 5.9 (L) 6.5 - 8.1 g/dL   Albumin 3.0 (L) 3.5 - 5.0 g/dL   AST 30 15 - 41 U/L   ALT 15 0 - 44 U/L   Alkaline Phosphatase 136 (H) 38 - 126 U/L   Total Bilirubin 0.6 0.3 - 1.2 mg/dL   GFR, Estimated >30 >86 mL/min    Anion gap 12 5 - 15  Protein / creatinine ratio, urine     Status: None   Collection Time: 08/09/22  9:02 AM  Result Value Ref Range   Creatinine, Urine 94 mg/dL   Total Protein, Urine 11 mg/dL   Protein Creatinine Ratio 0.12 0.00 - 0.15 mg/mg[Cre]  ABO/Rh     Status: None   Collection Time: 08/09/22  9:58 AM  Result Value Ref Range   ABO/RH(D)      O POS Performed at [facility], [address], [place], [place] [address]     Assessment:   22 y.o. G1P0 [redacted]w[redacted]d admitted for IOL secondary to [facility]   Plan:  1) Labor -received cytotec PV and Cytoec oral at 0920, at 1329 and 1804, cook's catheter deflated. VE 4-5 cm, Pitocin infusing    2) Fetus - category I tracing   3) GBS negative, membranes intact   4) GHTN: BP's mild to normal range, labs WNL   5) Pain  management: epidural managed by Anesthesia   Dr Valentino Saxon update on pt's status  Jannifer Hick   [facility]  08/10/2022 5:37 AM

## 2022-08-10 NOTE — Progress Notes (Signed)
Intrapartum Progress Note  Called by Pamela Salas, CNM to assess patient due to minimal change overnight. Patient undergoing IOL for gHTN at term. Also elevated BMI (40).   S: Patient overall comfortable with epidural, not feeling contractions.  O: Blood pressure 133/80, pulse (!) 119, temperature 98.2 F (36.8 C), temperature source Oral, resp. rate 18, height 5\' 1"  (1.549 m), weight 98 kg, last menstrual period 11/05/2021, SpO2 100 %. Gen App: NAD, comfortable Abdomen: soft, gravid FHT: baseline 145 bpm.  Accels present.  Decels absent. moderate in degree variability.   Tocometer: contractions q 1-3 minutes Cervix: 4/60/-3, cervix slightly thicker on right.  Extremities: Nontender, no edema.  Pitocin: 10 mIU  Labs:  Results for orders placed or performed during the hospital encounter of 08/09/22  CBC  Result Value Ref Range   WBC 9.9 4.0 - 10.5 K/uL   RBC 4.52 3.87 - 5.11 MIL/uL   Hemoglobin 11.4 (L) 12.0 - 15.0 g/dL   HCT 40.9 (L) 81.1 - 91.4 %   MCV 78.8 (L) 80.0 - 100.0 fL   MCH 25.2 (L) 26.0 - 34.0 pg   MCHC 32.0 30.0 - 36.0 g/dL   RDW 78.2 (H) 95.6 - 21.3 %   Platelets 190 150 - 400 K/uL   nRBC 0.0 0.0 - 0.2 %  RPR  Result Value Ref Range   RPR Ser Ql NON REACTIVE NON REACTIVE  Comprehensive metabolic panel  Result Value Ref Range   Sodium 136 135 - 145 mmol/L   Potassium 3.4 (L) 3.5 - 5.1 mmol/L   Chloride 106 98 - 111 mmol/L   CO2 18 (L) 22 - 32 mmol/L   Glucose, Bld 152 (H) 70 - 99 mg/dL   BUN 6 6 - 20 mg/dL   Creatinine, Ser 0.86 0.44 - 1.00 mg/dL   Calcium 8.5 (L) 8.9 - 10.3 mg/dL   Total Protein 5.9 (L) 6.5 - 8.1 g/dL   Albumin 3.0 (L) 3.5 - 5.0 g/dL   AST 30 15 - 41 U/L   ALT 15 0 - 44 U/L   Alkaline Phosphatase 136 (H) 38 - 126 U/L   Total Bilirubin 0.6 0.3 - 1.2 mg/dL   GFR, Estimated >57 >84 mL/min   Anion gap 12 5 - 15  Protein / creatinine ratio, urine  Result Value Ref Range   Creatinine, Urine 94 mg/dL   Total Protein, Urine 11 mg/dL    Protein Creatinine Ratio 0.12 0.00 - 0.15 mg/mg[Cre]  Type and screen  Result Value Ref Range   ABO/RH(D) O POS    Antibody Screen NEG    Sample Expiration      08/12/2022,2359 Performed at [facility], [address], [place], [place] [address]   ABO/Rh  Result Value Ref Range   ABO/RH(D)      O POS Performed at [facility], [address] [place], [place] [address]      Assessment:  1: SIUP at [redacted]w[redacted]d, Category I tracing currently 2. GHTN 3. Obesity in pregnancy  Plan:  1. Patient has remained unchanged overnight since foley bulb removal at ~ midnight. Currently on pitocin. AROM'd with light meconinum stained fluid. 2. IUPC placed  3. Continue pitocin for augmentation  Hildred Laser, MD 08/10/2022 9:41 AM

## 2022-08-11 ENCOUNTER — Encounter: Payer: Self-pay | Admitting: Obstetrics and Gynecology

## 2022-08-11 LAB — CBC
HCT: 25.8 % — ABNORMAL LOW (ref 36.0–46.0)
Hemoglobin: 8 g/dL — ABNORMAL LOW (ref 12.0–15.0)
MCH: 25.3 pg — ABNORMAL LOW (ref 26.0–34.0)
MCHC: 31 g/dL (ref 30.0–36.0)
MCV: 81.6 fL (ref 80.0–100.0)
Platelets: 176 10*3/uL (ref 150–400)
RBC: 3.16 MIL/uL — ABNORMAL LOW (ref 3.87–5.11)
RDW: 17.3 % — ABNORMAL HIGH (ref 11.5–15.5)
WBC: 23.4 10*3/uL — ABNORMAL HIGH (ref 4.0–10.5)
nRBC: 0 % (ref 0.0–0.2)

## 2022-08-11 MED ORDER — GABAPENTIN 100 MG PO CAPS
100.0000 mg | ORAL_CAPSULE | Freq: Three times a day (TID) | ORAL | Status: DC
  Administered 2022-08-11 – 2022-08-12 (×4): 100 mg via ORAL
  Filled 2022-08-11 (×4): qty 1

## 2022-08-11 MED ORDER — OXYCODONE HCL 5 MG PO TABS
5.0000 mg | ORAL_TABLET | ORAL | Status: DC | PRN
  Administered 2022-08-11 – 2022-08-12 (×3): 5 mg via ORAL
  Filled 2022-08-11 (×3): qty 1

## 2022-08-11 MED ORDER — ACETAMINOPHEN 500 MG PO TABS
1000.0000 mg | ORAL_TABLET | Freq: Four times a day (QID) | ORAL | Status: AC
  Administered 2022-08-11 (×3): 1000 mg via ORAL
  Filled 2022-08-11 (×3): qty 2

## 2022-08-11 MED ORDER — PRENATAL MULTIVITAMIN CH
1.0000 | ORAL_TABLET | Freq: Every day | ORAL | Status: DC
  Administered 2022-08-11 – 2022-08-12 (×2): 1 via ORAL
  Filled 2022-08-11 (×2): qty 1

## 2022-08-11 MED ORDER — BUPROPION HCL ER (SR) 150 MG PO TB12
150.0000 mg | ORAL_TABLET | Freq: Every day | ORAL | Status: DC
  Administered 2022-08-11 – 2022-08-12 (×2): 150 mg via ORAL
  Filled 2022-08-11 (×2): qty 1

## 2022-08-11 MED ORDER — ACETAMINOPHEN 500 MG PO TABS: 1000.0000 mg | ORAL_TABLET | Freq: Four times a day (QID) | ORAL | Status: DC

## 2022-08-11 MED ORDER — KETOROLAC TROMETHAMINE 30 MG/ML IJ SOLN
30.0000 mg | Freq: Four times a day (QID) | INTRAMUSCULAR | Status: DC
  Administered 2022-08-11 (×3): 30 mg via INTRAVENOUS
  Filled 2022-08-11 (×4): qty 1

## 2022-08-11 MED ORDER — ACETAMINOPHEN 500 MG PO TABS
1000.0000 mg | ORAL_TABLET | Freq: Four times a day (QID) | ORAL | Status: DC
  Administered 2022-08-11 – 2022-08-12 (×3): 1000 mg via ORAL
  Filled 2022-08-11 (×3): qty 2

## 2022-08-11 MED ORDER — SIMETHICONE 80 MG PO CHEW
80.0000 mg | CHEWABLE_TABLET | ORAL | Status: DC | PRN
  Administered 2022-08-11 – 2022-08-12 (×2): 80 mg via ORAL
  Filled 2022-08-11 (×2): qty 1

## 2022-08-11 MED ORDER — COCONUT OIL OIL
1.0000 | TOPICAL_OIL | Status: DC | PRN
  Administered 2022-08-11: 1 via TOPICAL
  Filled 2022-08-11: qty 7.5

## 2022-08-11 MED ORDER — SENNOSIDES-DOCUSATE SODIUM 8.6-50 MG PO TABS
2.0000 | ORAL_TABLET | Freq: Every day | ORAL | Status: DC
  Administered 2022-08-11 – 2022-08-12 (×2): 2 via ORAL
  Filled 2022-08-11 (×2): qty 2

## 2022-08-11 MED ORDER — DIPHENHYDRAMINE HCL 25 MG PO CAPS: 25.0000 mg | ORAL_CAPSULE | Freq: Four times a day (QID) | ORAL | Status: DC | PRN

## 2022-08-11 MED ORDER — IBUPROFEN 600 MG PO TABS: 600.0000 mg | ORAL_TABLET | Freq: Four times a day (QID) | ORAL | Status: DC

## 2022-08-11 MED ORDER — WITCH HAZEL-GLYCERIN EX PADS: 1.0000 | MEDICATED_PAD | CUTANEOUS | Status: DC | PRN

## 2022-08-11 MED ORDER — SIMETHICONE 80 MG PO CHEW
80.0000 mg | CHEWABLE_TABLET | Freq: Three times a day (TID) | ORAL | Status: DC
  Administered 2022-08-11 – 2022-08-12 (×4): 80 mg via ORAL
  Filled 2022-08-11 (×4): qty 1

## 2022-08-11 MED ORDER — ZOLPIDEM TARTRATE 5 MG PO TABS: 5.0000 mg | ORAL_TABLET | Freq: Every evening | ORAL | Status: DC | PRN

## 2022-08-11 MED ORDER — KETOROLAC TROMETHAMINE 30 MG/ML IJ SOLN
30.0000 mg | Freq: Four times a day (QID) | INTRAMUSCULAR | Status: DC
  Filled 2022-08-11: qty 1

## 2022-08-11 MED ORDER — MENTHOL 3 MG MT LOZG: 1.0000 | LOZENGE | OROMUCOSAL | Status: DC | PRN

## 2022-08-11 MED ORDER — DIBUCAINE (PERIANAL) 1 % EX OINT: 1.0000 | TOPICAL_OINTMENT | CUTANEOUS | Status: DC | PRN

## 2022-08-11 MED ORDER — IBUPROFEN 600 MG PO TABS
600.0000 mg | ORAL_TABLET | Freq: Four times a day (QID) | ORAL | Status: DC
  Administered 2022-08-11 – 2022-08-12 (×3): 600 mg via ORAL
  Filled 2022-08-11 (×3): qty 1

## 2022-08-11 NOTE — Anesthesia Post-op Follow-up Note (Signed)
  Anesthesia Pain Follow-up Note  Patient: Pamela Salas [facility]  Day #: 1  Date of Follow-up: 08/11/2022 Time: 8:22 AM  Last Vitals:  Vitals:   08/11/22 0306 08/11/22 0811  BP: 139/85 (!) 136/97  Pulse: (!) 120 (!) 107  Resp: 18   Temp: 36.8 C   SpO2: 98% 98%    Level of Consciousness: alert  Pain: none   Side Effects:None  Catheter Site Exam:clean, dry, no drainage     Plan: D/C from anesthesia care at surgeon's request  Rosanne Gutting

## 2022-08-11 NOTE — Anesthesia Postprocedure Evaluation (Signed)
Anesthesia Post Note  Patient: Pamela Salas  Procedure(s) Performed: CESAREAN SECTION  Patient location during evaluation: Mother Baby Anesthesia Type: Epidural and Spinal Level of consciousness: awake and alert Pain management: pain level controlled Vital Signs Assessment: post-procedure vital signs reviewed and stable Respiratory status: spontaneous breathing, nonlabored ventilation and respiratory function stable Cardiovascular status: stable Postop Assessment: no headache, no backache and epidural receding Anesthetic complications: no   No notable events documented.   Last Vitals:  Vitals:   08/11/22 0306 08/11/22 0811  BP: 139/85 (!) 136/97  Pulse: (!) 120 (!) 107  Resp: 18   Temp: 36.8 C   SpO2: 98% 98%    Last Pain:  Vitals:   08/11/22 0306  TempSrc: Oral  PainSc:                  Rosanne Gutting

## 2022-08-11 NOTE — Discharge Summary (Signed)
Postpartum Discharge Summary  Date of Service updated: 08/12/2022     Patient Name: Pamela Salas DOB: 2000/04/30 MRN: [id number]  Date of admission: 08/09/2022 Delivery date:08/10/2022  Delivering provider: Hildred Laser  Date of discharge: 08/12/2022   Admitting diagnosis: Encounter for induction of labor [Z34.90] Intrauterine pregnancy: [redacted]w[redacted]d     Secondary diagnosis:  Principal Problem:   Gestational hypertension Active Problems:   GAD (generalized anxiety disorder)   Encounter for induction of labor   Failed medical induction of labor   Fetal macrosomia affecting management of mother, antepartum   Anemia due to acute blood loss   Postpartum atony of uterus with hemorrhage  Additional problems: GHTN    Discharge diagnosis: Term Pregnancy Delivered, Gestational Hypertension, Anemia, and PPH                                              Post partum procedures: None Augmentation: AROM, Pitocin, Cytotec, and IP Foley Complications: Failed induction  Hospital course: Induction of Labor With Cesarean Section   22 y.o. yo G1P1001 at [redacted]w[redacted]d was admitted to the hospital 08/09/2022 for induction of labor secondary to gestational HTN at term. Patient had a labor course significant for stalled progress at 5 cm after mechanical dilation using Cook's catheter. The patient went for cesarean section due to failed medical induction of labor . Delivery details are as follows: Membrane Rupture Time/Date: 9:32 AM ,08/10/2022   Delivery Method:C-Section, Low Transverse  Details of operation can be found in separate operative Note.  Patient had a postpartum course that was complicated by immediate postpartum hemorrhage intraoperatively due to uterine atony (QBL 1370 ml).  She was treated with IV 1 gram TXA and PR Cytotec 800 mcg.  Remainder of her postpartum course was unremarkable. She is ambulating, tolerating a regular diet, passing flatus, and urinating well.  Patient with multiple mildly elevated blood  pressures postpartum, initiated on Labetalol 100 mg BID. Patient is discharged home in stable condition on 08/12/22.      Newborn Data: Birth date:08/10/2022  Birth time:9:08 PM  Gender:Female  Living status:Living  Apgars:7 ,9  Weight:4630 g                                Magnesium Sulfate received: No BMZ received: No Rhophylac:N/A MMR:N/A T-DaP:Given prenatally Flu: No Transfusion:No  Physical exam  Vitals:   08/11/22 2307 08/12/22 0328 08/12/22 0802 08/12/22 1158  BP: (!) 125/91 128/85 127/88 (!) 128/90  Pulse: (!) 111 95 100 (!) 104  Resp: 18 18 18 18   Temp: 98.2 F (36.8 C) 98.2 F (36.8 C) 98.2 F (36.8 C) 98 F (36.7 C)  TempSrc:  Oral Oral Oral  SpO2: 95% 97% 98% 99%  Weight:      Height:       General: alert Lochia: appropriate Uterine Fundus: firm Incision: Healing well with no significant drainage, No significant erythema, Dressing is clean, dry, and intact DVT Evaluation: No evidence of DVT seen on physical exam. Negative Homan's sign. No cords or calf tenderness. No significant calf/ankle edema.  Labs: Lab Results  Component Value Date   WBC 23.4 (H) 08/11/2022   HGB 8.0 (L) 08/11/2022   HCT 25.8 (L) 08/11/2022   MCV 81.6 08/11/2022   PLT 176 08/11/2022    Edinburgh Score:  08/11/2022    8:10 AM  Edinburgh Postnatal Depression Scale Screening Tool  I have been able to laugh and see the funny side of things. 0  I have looked forward with enjoyment to things. 0  I have blamed myself unnecessarily when things went wrong. 0  I have been anxious or worried for no good reason. 0  I have felt scared or panicky for no good reason. 0  Things have been getting on top of me. 0  I have been so unhappy that I have had difficulty sleeping. 0  I have felt sad or miserable. 0  I have been so unhappy that I have been crying. 0  The thought of harming myself has occurred to me. 0  Edinburgh Postnatal Depression Scale Total 0      After visit meds:   Allergies as of 08/12/2022   No Known Allergies      Medication List     STOP taking these medications    aspirin EC 81 MG tablet   doxylamine (Sleep) 25 MG tablet Commonly known as: UNISOM       TAKE these medications    buPROPion 150 MG 12 hr tablet Commonly known as: WELLBUTRIN SR TAKE 1 TABLET BY MOUTH EVERY DAY   docusate sodium 100 MG capsule Commonly known as: COLACE Take 1 capsule (100 mg total) by mouth 2 (two) times daily as needed.   ferrous sulfate 325 (65 FE) MG EC tablet Take 1 tablet (325 mg total) by mouth 2 (two) times daily.   ibuprofen 600 MG tablet Commonly known as: ADVIL Take 1 tablet (600 mg total) by mouth every 6 (six) hours.   labetalol 100 MG tablet Commonly known as: NORMODYNE Take 1 tablet (100 mg total) by mouth 2 (two) times daily.   multivitamin-prenatal 27-0.8 MG Tabs tablet Take 1 tablet by mouth daily at 12 noon.   traMADol 50 MG tablet Commonly known as: Ultram Take 1 tablet (50 mg total) by mouth every 6 (six) hours as needed for moderate pain (Can take 2 tablets for severe pain every 6 hours).         Discharge home in stable condition Infant Feeding: Bottle Infant Disposition:home with mother Discharge instruction: per After Visit Summary and Postpartum booklet. Activity: Advance as tolerated. Pelvic rest for 6 weeks.  Diet: routine diet Anticipated Birth Control: OCPs Postpartum Appointment:6 weeks Additional Postpartum F/U: Incision check 1 week Future Appointments:No future appointments. Follow up Visit:      Hildred Laser, MD [facility] 08/12/2022 2:21 PM

## 2022-08-11 NOTE — Lactation Note (Signed)
This note was copied from a baby's chart. Lactation Consultation Note  Patient Name: Pamela Salas [id number] Date: 08/11/2022 Age:23 hours Reason for consult: Follow-up assessment   Maternal Data    Feeding Mother's Current Feeding Choice: Breast Milk Mom states she was able to latch baby to right breast without nipple shield after pre pumping, and baby nursed off and on x 10 min, she states she felt tugs.  LATCH Score Latch:  (I did not observe the feeding)                  Lactation Tools Discussed/Used Breast pump type: Double-Electric Breast Pump Reason for Pumping: pre pump  Interventions Interventions: Pre-pump if needed  Discharge    Consult Status Consult Status: PRN Date: 08/11/22 Follow-up type: In-patient    Dyann Kief 08/11/2022, 6:56 PM

## 2022-08-11 NOTE — Lactation Note (Addendum)
This note was copied from a baby's chart. Lactation Consultation Note  Patient Name: Pamela Salas [id number] Date: 08/11/2022 Age:23 hours Reason for consult: Follow-up assessment   Maternal Data Has patient been taught Hand Expression?: Yes Does the patient have breastfeeding experience prior to this delivery?: No  Feeding Mother's Current Feeding Choice: Breast Milk BAby has recessed chin, pulls in chin, gaggy, not latching to breast, attempted with 20 mm nipple shield on left breast in football hold,, not opening mouth well, will latch but consistently pulls off  breast, visitors in, at 1100 am attempted on right breast in football hold, would not latch without nipple shield, few sucks with shield, tight jaw , mom able to hand express drops of colostrum easily   LATCH Score Latch: Repeated attempts needed to sustain latch, nipple held in mouth throughout feeding, stimulation needed to elicit sucking reflex.  Audible Swallowing: A few with stimulation  Type of Nipple: Everted at rest and after stimulation  Comfort (Breast/Nipple): Soft / non-tender  Hold (Positioning): Assistance needed to correctly position infant at breast and maintain latch.  LATCH Score: 7   Lactation Tools Discussed/Used Tools: Nipple Shields Nipple shield size: 20  Interventions Interventions: Assisted with latch;Skin to skin;Hand express;Support pillows;Education  Discharge Pump: Personal WIC Program: No  Consult Status Consult Status: Follow-up Date: 08/11/22 Follow-up type: In-patient    Dyann Kief 08/11/2022, 12:39 PM

## 2022-08-11 NOTE — Progress Notes (Signed)
Postpartum Day # 1: Cesarean Delivery. Gestational HTN this pregnancy.   Subjective: Patient reports tolerating PO.  Has not attempted to void or ambulate yet as catheter in place. Notes pain is controlled with pain medications. Attempting to breastfeed.   Objective: Vital signs in last 24 hours: Temp:  [98.2 F (36.8 C)-99.6 F (37.6 C)] 98.2 F (36.8 C) (07/05 0306) Pulse Rate:  [107-141] 107 (07/05 0811) Resp:  [10-30] 18 (07/05 0306) BP: (119-158)/(66-97) 136/97 (07/05 0811) SpO2:  [94 %-100 %] 98 % (07/05 0811)  Physical Exam:  General: alert and no distress Lungs: clear to auscultation bilaterally Breasts: normal appearance, no masses or tenderness Heart: regular rate and rhythm, S1, S2 normal, no murmur, click, rub or gallop Abdomen: soft, non-tender; bowel sounds normal; no masses,  no organomegaly Pelvis: Lochia appropriate, Uterine Fundus firm, Incision: bandage intact, clean/dry.  Extremities: DVT Evaluation: No evidence of DVT seen on physical exam. Negative Homan's sign. No cords or calf tenderness. No significant calf/ankle edema.   Recent Labs    08/09/22 0902 08/11/22 0548  HGB 11.4* 8.0*  HCT 35.6* 25.8*    Assessment/Plan: Status post Cesarean section. Doing well postoperatively.  Breastfeeding, and Lactation consult Contraception undecided Advance diet Continue PO pain management Remove foley catheter Discontinue IVF once tolerating full diet. Anemia secondary to postpartum hemorrhage. Currently asymptomatic.  Treat with PO iron.  GHTN, currently on no meds. Continue to monitor BPs.  Continue current care.   Hildred Laser, MD [facility]

## 2022-08-11 NOTE — Lactation Note (Addendum)
This note was copied from a baby's chart. Lactation Consultation Note  Patient Name: Pamela Salas [id number] Date: 08/11/2022 Age:23 hours Reason for consult: Follow-up assessment   Maternal Data    Feeding Mother's Current Feeding Choice: Breast Milk and Formula Mom assisted with latching baby to breast, pre pumped left breast, attempted without shield, baby still has tight jaw and not opening mouth well, attempted with shield, able to latch a few times but pulls off breast frequently, then will not suck, DEBP initiated in initiation mode, small amt colostrum obtained by curved tip syringe in flange on both breasts, attempted syringe feed the EBM and of 2-3 cc Similac on gloved finger but baby would not suck well, gagged x 2, attempted paced bottle feeding with slow flow nipple in side lying position, no gagging but very little sucking on nipple, feeding ended with approx. 2 cc intake LATCH Score Latch: Repeated attempts needed to sustain latch, nipple held in mouth throughout feeding, stimulation needed to elicit sucking reflex.  Audible Swallowing: None  Type of Nipple: Everted at rest and after stimulation  Comfort (Breast/Nipple): Soft / non-tender  Hold (Positioning): Assistance needed to correctly position infant at breast and maintain latch.  LATCH Score: 6   Lactation Tools Discussed/Used Tools: 60F feeding tube / Syringe;Bottle Nipple shield size: 20 Breast pump type: Double-Electric Breast Pump Pump Education: Setup, frequency, and cleaning;Milk Storage Reason for Pumping: poor latch Pumping frequency: q 3hr if no latch Pumped volume:  (drops)  Interventions Interventions: Pace feeding  Discharge    Consult Status Consult Status: Follow-up Date: 08/12/22 Follow-up type: In-patient    Dyann Kief 08/11/2022, 3:49 PM

## 2022-08-12 DIAGNOSIS — D62 Acute posthemorrhagic anemia: Secondary | ICD-10-CM | POA: Diagnosis not present

## 2022-08-12 DIAGNOSIS — O3660X0 Maternal care for excessive fetal growth, unspecified trimester, not applicable or unspecified: Secondary | ICD-10-CM

## 2022-08-12 HISTORY — DX: Maternal care for excessive fetal growth, unspecified trimester, not applicable or unspecified: O36.60X0

## 2022-08-12 MED ORDER — TRAMADOL HCL 50 MG PO TABS: 50.0000 mg | ORAL_TABLET | Freq: Four times a day (QID) | ORAL | 0 refills | Status: DC | PRN

## 2022-08-12 MED ORDER — FERROUS SULFATE 325 (65 FE) MG PO TBEC: 325.0000 mg | DELAYED_RELEASE_TABLET | Freq: Two times a day (BID) | ORAL | 2 refills | Status: AC

## 2022-08-12 MED ORDER — LABETALOL HCL 100 MG PO TABS: 100.0000 mg | ORAL_TABLET | Freq: Two times a day (BID) | ORAL | 0 refills | Status: DC

## 2022-08-12 MED ORDER — IBUPROFEN 600 MG PO TABS: 600.0000 mg | ORAL_TABLET | Freq: Four times a day (QID) | ORAL | 0 refills | Status: AC

## 2022-08-12 MED ORDER — DOCUSATE SODIUM 100 MG PO CAPS: 100.0000 mg | ORAL_CAPSULE | Freq: Two times a day (BID) | ORAL | 2 refills | Status: AC | PRN

## 2022-08-12 NOTE — Discharge Instructions (Signed)
Call the office to schedule a follow up appointment for 1 week incision check, 2 week video visit and 6 week postpartum visit.  If you have questions or concerns you should call the office or the on call provider.  If you have urgent concerns go to the nearest emergency department for evaluation.

## 2022-08-12 NOTE — Progress Notes (Signed)
Discharge instructions reviewed. Printed copies given to patient for reference after discharge home.  Questions answered and follow up care reviewed.

## 2022-08-14 ENCOUNTER — Encounter: Payer: Self-pay | Admitting: Licensed Practical Nurse

## 2022-08-15 ENCOUNTER — Telehealth: Payer: Self-pay

## 2022-08-15 NOTE — Telephone Encounter (Signed)
[facility]- Discharge Call Backs-Left Voicemail about the following below. 1-Do you have any questions or concerns about yourself as you heal?  C-Sec-Is your dressing off?/Vag Del? 2-Any concerns or questions about your baby? Is your baby eating, peeing,pooping well? 3-Where does your baby sleep in your home? Reviewed ABC's of safe sleep. 4-How was your stay at the hospital? 5- Did our team work together to care for you? You should be receiving a survey in the mail soon.   We would really appreciate it if you could fill that out for us and return it in the mail.  We value the feedback to make improvements and continue the great work we do.   If you have any questions please feel free to call me back at [phone]  

## 2022-08-16 ENCOUNTER — Ambulatory Visit (INDEPENDENT_AMBULATORY_CARE_PROVIDER_SITE_OTHER): Payer: BC Managed Care – PPO | Admitting: Obstetrics and Gynecology

## 2022-08-16 ENCOUNTER — Encounter: Payer: Self-pay | Admitting: Obstetrics and Gynecology

## 2022-08-16 VITALS — BP 138/92 | HR 96 | Resp 16 | Ht 61.0 in | Wt 191.4 lb

## 2022-08-16 DIAGNOSIS — Z98891 History of uterine scar from previous surgery: Secondary | ICD-10-CM

## 2022-08-16 DIAGNOSIS — K5900 Constipation, unspecified: Secondary | ICD-10-CM

## 2022-08-16 DIAGNOSIS — D62 Acute posthemorrhagic anemia: Secondary | ICD-10-CM

## 2022-08-16 DIAGNOSIS — Z4889 Encounter for other specified surgical aftercare: Secondary | ICD-10-CM

## 2022-08-16 DIAGNOSIS — O133 Gestational [pregnancy-induced] hypertension without significant proteinuria, third trimester: Secondary | ICD-10-CM

## 2022-08-16 NOTE — Patient Instructions (Signed)

## 2022-08-16 NOTE — Progress Notes (Signed)
    OBSTETRICS/GYNECOLOGY POST-OPERATIVE CLINIC VISIT  Subjective:     Pamela Salas is a 22 y.o. female who presents to the clinic 1 weeks status post CESAREAN SECTION  for failed induction of lab or (arrested at 5 cm) for [facility]. Had postpartum hemorrhage due to uterine atony intraoperatively.  Patient reports decreased appetite. Bowel movements are abnormal:  she has not had a bowel movement in a week . Pain is controlled with current analgesics. Medications being used: ibuprofen (OTC) and narcotic analgesics including tramadol (Ultram).  The following portions of the patient's history were reviewed and updated as appropriate: allergies, current medications, past family history, past medical history, past social history, past surgical history, and problem list.  Review of Systems Pertinent items noted in HPI and remainder of comprehensive ROS otherwise negative.   Objective:   BP (!) 138/92   Pulse 96   Resp 16   Ht 5\' 1"  (1.549 m)   Wt 191 lb 6.4 oz (86.8 kg)   BMI 36.16 kg/m  Body mass index is 36.16 kg/m.  General:  alert and no distress  Abdomen: soft, bowel sounds active, non-tender  Incision:   healing well, no drainage, no erythema, no hernia, no seroma, no swelling, no dehiscence, incision well approximated    Pathology:  N/A  Labs:  Lab Results  Component Value Date   WBC 23.4 (H) 08/11/2022   HGB 8.0 (L) 08/11/2022   HCT 25.8 (L) 08/11/2022   MCV 81.6 08/11/2022   PLT 176 08/11/2022     Assessment:   Patient s/p CESAREAN SECTION   (surgery)  Constipation Gestational HTN Postpartum anemia due to postpartum hemorrhage, asymptomatic   Plan:   1. Continue any current medications as instructed by provider. Currently on Labetalol 100 mg BID for BPs.  On PO iron for asymptomatic anemia postpartum.  2. Wound care discussed. 3. Advised on use of Miralax or natural remedies (prune juice/coffee) for constipation 4. Activity restrictions: no bending, stooping,  or squatting, no lifting more than 10-15 pounds, and pelvic rest 5. Anticipated return to work:  5 weeks, if indicated . 6. Follow up:  1-2 weeks for PP mood check,  6 weeks for postpartum visit.    Hildred Laser, MD [facility]

## 2022-08-26 ENCOUNTER — Ambulatory Visit: Payer: Self-pay

## 2022-08-26 NOTE — Lactation Note (Signed)
This note was copied from a baby's chart. Lactation Consultation Note  Patient Name: Pamela Salas [id number] Date: 08/26/2022 Age:23 wk.o.  Reason for consult: follow-up assessment, primipara, term, difficult latch, breastfeeding assistance   Maternal Data  This is mom's 1st baby, C/S for arrest of dilation. Mom with history of hemorrhage of >than 1000 ml's, GHTN at 38 weeks, anxiety, and obesity. Baby born 08/10/2023 at 39 5/7 weeks had latch on difficulty associated with small mouth and recessed chin per chart review. Nipple shield was attempted.  At today's visit mom reports she would like assistance with breastfeeding and pumping. She is pumping every 2 hours for 20 minutes. She pumps 1.5-2 ounces at most pump session and her first am pump session she pumps  3-4 ounces. For past 24 hours mom pumped 17 ounces in 24 hours. Baby is bottle feeding mom's expressed breastmilk and the formula supplement. Mom has attempted to breastfeed however baby is upset when she offers her to the breast and she will not sustain latch and feed. Baby in past 24 hours bottle fed 7 times for a total of 17 ounces of mom's breastmilk. The previous day baby received 13 ounces of mom's breastmilk and 4 ounces of formula supplement.  Feeding assessment with pre and post weight done with Medela baby weigh scale. Baby transferred 22 ml's in 21 minutes. Post breastfeeding dad provided baby with expressed breastmilk while mom pumped. Mom expressed about the same amount she has expressed with her pump at home. Total feed of 76 ml's.  Feeding  Mom's goal is to provide baby breastmilk feeds and her goal is to breastfed.  Provided mom with tips and strategies to maximize  position and latch technique when breastfeeding. Baby was alert, awake, and agreeable to latch to the breast. Recommended mom massage her breast to encourage milk flow to keep baby interested in feeding. Also, recommended tactile stimulation to keep baby  interested when at the breast.Per parents this is first time baby sustained latch and breastfed. Reviewed breastfeeding plan for home.    Lactation Tools Discussed/Used  DEBP: provided mom tips and strategies to maximize pump use to increase milk production..  Interventions  Reviewed breastfeeding basics, breast compression, adjust pillows, support pillows, DEBP education.   Feeding plan provided to parents: Provide baby at least 8 feeds in 24 hours , your baby may take 8-12 feeds in 24 hours. In daylight hours at each feeding offer your baby to breastfeed before offering the baby the supplemental pumped breastmilk and/or formula. Position your baby providing good head support and "sandwich" your breast to help baby get a deep enough and good latch. While the baby is feeding massage your breast to encourage baby to continue feeding as needed. Look and listen for the baby's swallows when breastfeeding. Provide baby with tactile stimulation if she is latched and not continuing to feed despite massaging your breast . If she does not continue breastfeeding detach the baby from the breast and burp her. At each feeding you can offer both breasts. If the baby becomes disagreeable with latching and/or breastfeeding stop the breastfeeding session and provide her the expressed milk and/or supplemental milk via slow flow bottle. Aim for about 20 minutes of breastfeeding as long as the baby is actively feeding. For now offer the baby post breastfeed 2.5 ounces of milk and follow her cues as to when she is done. As she gets more proficient at breastfeeding she will take less and less from the bottle. However if  she breastfeeds and takes her bottle and continues to show feeding cues offer her additional milk.  Babies will take a minimum of 2 ounces per feeding and may take as many as 5 ounces per feeding for an average of 25 ounces of milk in 24 hours. Some babies take a little less and some may take a little  more in 24 hours. Mom continue to post pump for now until baby is fully established at the breast. Use the stimulation phase at least 2-3 times each time you pump when you note the milk slowing down. Follow-up with [facility] by phone on Wednesday July 24th.  Discharge  Outpatient recommendation for follow-up, breastfeeding plan reviewed.  Consult Status  PRN    Fuller Song 08/26/2022, 8:43 PM

## 2022-08-31 ENCOUNTER — Encounter: Payer: Self-pay | Admitting: Obstetrics and Gynecology

## 2022-08-31 ENCOUNTER — Telehealth (INDEPENDENT_AMBULATORY_CARE_PROVIDER_SITE_OTHER): Payer: BC Managed Care – PPO | Admitting: Obstetrics and Gynecology

## 2022-08-31 DIAGNOSIS — Z1332 Encounter for screening for maternal depression: Secondary | ICD-10-CM

## 2022-08-31 NOTE — Patient Instructions (Signed)

## 2022-08-31 NOTE — Progress Notes (Signed)
      Virtual Visit via Video Note  I connected with Pamela Salas on 08/31/22 at  10:55 AM EDT by a video enabled telemedicine application and verified that I am speaking with the correct person using two identifiers.  Location: Patient: Home  Provider: Provider   I discussed the limitations of evaluation and management by telemedicine and the availability of in person appointments. The patient expressed understanding and agreed to proceed.    History of Present Illness:   Pamela Salas is a 22 y.o. G2P1001 female who presents for a 2 week televisit for mood check. She is 2 weeks postpartum following a low transverse cesarean delivery.  The delivery was at 39.5 gestational weeks, c-section performed for failed induction of labor .  Postpartum course has been well so far. Baby is feeding by breast. Bleeding: spotting. Postpartum depression screening: negative.  EDPS score is 0.      The following portions of the patient's history were reviewed and updated as appropriate: allergies, current medications, past family history, past medical history, past social history, past surgical history, and problem list.   Observations/Objective:  Ht 5\' 1"  (1.549 m)   Wt 191 lb 6.4 oz (86.8 kg)   BMI 36.16 kg/m  Body mass index is 36.16 kg/m.  unknown if currently breastfeeding. Gen App: NAD Psych: normal speech, affect. Good mood.        08/16/2022    3:39 PM 08/11/2022    8:10 AM 12/23/2021    1:28 PM  Edinburgh Postnatal Depression Scale Screening Tool  I have been able to laugh and see the funny side of things. 0 0 0  I have looked forward with enjoyment to things. 0 0 0  I have blamed myself unnecessarily when things went wrong. 0 0 0  I have been anxious or worried for no good reason. 0 0 0  I have felt scared or panicky for no good reason. 0 0 0  Things have been getting on top of me. 1 0 0  I have been so unhappy that I have had difficulty sleeping. 0 0 0  I have felt sad or  miserable. 0 0 0  I have been so unhappy that I have been crying. 0 0 0  The thought of harming myself has occurred to me. 0 0 0  Edinburgh Postnatal Depression Scale Total 1 0 0         Assessment and Plan:   1. Encounter for screening for maternal depression - Screening Negative today. Will rescreen at 6 week postpartum visit. Overall doing well.    2. Postpartum state (s/p cesarean section) - Overall doing well. Continue routine postpartum home care.    3. Lactating mother - Breastfeeding going well. Notes good production.  Feeding every 3 hours. Infant has appropriate weight gain so far.    Follow Up Instructions:    RTC in 4 weeks for postpartum visit.    I discussed the assessment and treatment plan with the patient. The patient was provided an opportunity to ask questions and all were answered. The patient agreed with the plan and demonstrated an understanding of the instructions.   The patient was advised to call back or seek an in-person evaluation if the symptoms worsen or if the condition fails to improve as anticipated.   I provided 5 minutes of non-face-to-face time during this encounter.     Hildred Laser, MD [facility]

## 2022-09-03 ENCOUNTER — Other Ambulatory Visit: Payer: Self-pay | Admitting: Obstetrics and Gynecology

## 2022-09-18 ENCOUNTER — Encounter: Payer: Self-pay | Admitting: Obstetrics

## 2022-09-18 ENCOUNTER — Ambulatory Visit (INDEPENDENT_AMBULATORY_CARE_PROVIDER_SITE_OTHER): Payer: BC Managed Care – PPO | Admitting: Obstetrics

## 2022-09-18 DIAGNOSIS — D508 Other iron deficiency anemias: Secondary | ICD-10-CM | POA: Diagnosis not present

## 2022-09-18 NOTE — Progress Notes (Signed)
Post Partum Visit Note  Pamela Salas is a 22 y.o. G17P1001 female who presents for a postpartum visit. She is 6 weeks postpartum following a primary cesarean section.  I have fully reviewed the prenatal and intrapartum course and was present at the birth. The delivery was at [redacted]w[redacted]d.  Anesthesia: epidural and spinal. Postpartum course has been uncomplicated. Baby is doing well. Baby is feeding by breast. Bleeding: has likely resumed menses. Bowel function is normal. Bladder function is normal. Patient is not sexually active. Contraception method is condoms. Postpartum depression screening: negative. She was started on Zoloft by her PCP and feels it has helped her PPA.    The pregnancy intention screening data noted above was reviewed. Potential methods of contraception were discussed. The patient elected to proceed with No data recorded.    Health Maintenance Due  Topic Date Due   HPV VACCINES (3 - 3-dose series) 08/26/2021   COVID-19 Vaccine (1 - 2023-24 season) Never done   INFLUENZA VACCINE  09/07/2022    The following portions of the patient's history were reviewed and updated as appropriate: allergies, current medications, and problem list.  Review of Systems Pertinent items are noted in HPI.  Objective:  Ht 5\' 1"  (1.549 m)   Wt 182 lb 9.6 oz (82.8 kg)   Breastfeeding Yes   BMI 34.50 kg/m    General:  alert, cooperative, and appears stated age   Breasts:  normal  Lungs: clear to auscultation bilaterally  Heart:  regular rate and rhythm, S1, S2 normal, no murmur, click, rub or gallop  Abdomen: soft, non-tender; bowel sounds normal; no masses,  no organomegaly   Wound well approximated incision, remaining steristrips removed  GU exam:  not indicated       Assessment:   6 weeks PP  2. Other iron deficiency anemia  3. GHTN    6 week postpartum exam.   Plan:   Essential components of care per ACOG recommendations:  1.  Mood and well being: Patient with  negative depression screening today. Reviewed local resources for support.  - Patient tobacco use? No.   - hx of drug use? No.    2. Infant care and feeding:  -Patient currently breastmilk feeding?   -Social determinants of health (SDOH) reviewed in EPIC. No concerns.  3. Sexuality, contraception and birth spacing - Patient does not want a pregnancy in the next year.  Desired family size is undecided.  - Reviewed reproductive life planning. Reviewed contraceptive methods based on pt preferences and effectiveness.  Patient desired Female Condom today.    4. Sleep and fatigue -Encouraged family/partner/community support of 4 hrs of uninterrupted sleep to help with mood and fatigue  5. Physical Recovery  - Discussed patients delivery and complications. She describes her labor as "a lot." - Patient had a C-section. Wound healing reviewed. Patient expressed understanding - Patient has urinary incontinence? No. She is having decreased sensation to void but is not having incontinence or UTI symptoms - Patient is safe to resume physical and sexual activity  6.  Health Maintenance - HM due items addressed Yes - Last pap smear  Diagnosis  Date Value Ref Range Status  06/03/2021   Final   - Negative for intraepithelial lesion or malignancy (NILM)   Pap smear not done at today's visit.  -Breast Cancer screening indicated? No.   7. Chronic Disease/Pregnancy Condition follow up: Anemia and Hypertension -CBC today. Continue PO iron until results are back. -Decrease labetalol to 100  mg daily. BP check in one week.  - PCP follow up  Glenetta Borg, CNM [facility]

## 2022-09-19 ENCOUNTER — Encounter: Payer: Self-pay | Admitting: Obstetrics

## 2022-09-25 ENCOUNTER — Ambulatory Visit: Payer: BC Managed Care – PPO

## 2022-09-25 VITALS — BP 123/82 | HR 76 | Ht 61.0 in | Wt 180.1 lb

## 2022-09-25 DIAGNOSIS — Z0130 Encounter for examination of blood pressure without abnormal findings: Secondary | ICD-10-CM

## 2022-09-25 NOTE — Progress Notes (Signed)
    NURSE VISIT NOTE  Subjective:    Patient ID: Pamela Salas, female    DOB: [birth year], 22 y.o.   MRN: [id number]  HPI  Patient is a 22 y.o. G34P1001 female who presents for BP check per order from [name], CNM.   Patient reports compliance with prescribed BP medications: yes Labetalol 100 mgdaily Last dose of BP medication:  Labetalol 100 mg at 12:30  BP Readings from Last 3 Encounters:  09/25/22 123/82  09/18/22 120/85  08/16/22 (!) 138/92   Pulse Readings from Last 3 Encounters:  09/25/22 76  09/18/22 76  08/16/22 96    Objective:    BP 123/82   Pulse 76   Ht 5\' 1"  (1.549 m)   Wt 180 lb 1.6 oz (81.7 kg)   Breastfeeding Yes   BMI 34.03 kg/m   Assessment:   1. BP check      Plan:   Per Dr. Nicholaus Bloom, MD:  Discontinue Labetalol at this time and schedule a follow-up with PCP in one month.   Patient verbalized understanding of instructions.   Loman Chroman, CMA
